# Patient Record
Sex: Female | Born: 1960 | Race: White | Hispanic: No | Smoking: Former smoker
Health system: Southern US, Community
[De-identification: ages and names within clinical notes are randomized; demographics above are authoritative.]

## PROBLEM LIST (undated history)

## (undated) DIAGNOSIS — N83209 Unspecified ovarian cyst, unspecified side: Secondary | ICD-10-CM

## (undated) DIAGNOSIS — H9190 Unspecified hearing loss, unspecified ear: Secondary | ICD-10-CM

## (undated) DIAGNOSIS — O24419 Gestational diabetes mellitus in pregnancy, unspecified control: Secondary | ICD-10-CM

## (undated) HISTORY — PX: INTRAUTERINE DEVICE INSERTION: SHX323

## (undated) HISTORY — DX: Gestational diabetes mellitus in pregnancy, unspecified control: O24.419

## (undated) HISTORY — DX: Unspecified hearing loss, unspecified ear: H91.90

## (undated) HISTORY — DX: Unspecified ovarian cyst, unspecified side: N83.209

---

## 1998-06-14 ENCOUNTER — Other Ambulatory Visit: Admission: RE | Admit: 1998-06-14 | Discharge: 1998-06-14 | Payer: Self-pay | Admitting: Gynecology

## 1998-07-20 ENCOUNTER — Ambulatory Visit (HOSPITAL_COMMUNITY): Admission: RE | Admit: 1998-07-20 | Discharge: 1998-07-20 | Payer: Self-pay | Admitting: Gynecology

## 1998-07-20 ENCOUNTER — Encounter: Payer: Self-pay | Admitting: Gynecology

## 1999-08-07 ENCOUNTER — Other Ambulatory Visit: Admission: RE | Admit: 1999-08-07 | Discharge: 1999-08-07 | Payer: Self-pay | Admitting: Gynecology

## 1999-08-28 ENCOUNTER — Ambulatory Visit (HOSPITAL_COMMUNITY): Admission: RE | Admit: 1999-08-28 | Discharge: 1999-08-28 | Payer: Self-pay | Admitting: Family Medicine

## 1999-08-28 ENCOUNTER — Encounter: Payer: Self-pay | Admitting: Gynecology

## 2000-07-28 ENCOUNTER — Other Ambulatory Visit: Admission: RE | Admit: 2000-07-28 | Discharge: 2000-07-28 | Payer: Self-pay | Admitting: Family Medicine

## 2000-09-30 ENCOUNTER — Encounter: Payer: Self-pay | Admitting: Family Medicine

## 2000-09-30 ENCOUNTER — Ambulatory Visit (HOSPITAL_COMMUNITY): Admission: RE | Admit: 2000-09-30 | Discharge: 2000-09-30 | Payer: Self-pay | Admitting: Family Medicine

## 2001-10-14 ENCOUNTER — Encounter: Payer: Self-pay | Admitting: Family Medicine

## 2001-10-14 ENCOUNTER — Ambulatory Visit (HOSPITAL_COMMUNITY): Admission: RE | Admit: 2001-10-14 | Discharge: 2001-10-14 | Payer: Self-pay | Admitting: Family Medicine

## 2002-01-19 ENCOUNTER — Other Ambulatory Visit: Admission: RE | Admit: 2002-01-19 | Discharge: 2002-01-19 | Payer: Self-pay | Admitting: Family Medicine

## 2002-10-27 ENCOUNTER — Ambulatory Visit (HOSPITAL_COMMUNITY): Admission: RE | Admit: 2002-10-27 | Discharge: 2002-10-27 | Payer: Self-pay | Admitting: Family Medicine

## 2002-10-27 ENCOUNTER — Encounter: Payer: Self-pay | Admitting: Family Medicine

## 2003-09-07 ENCOUNTER — Other Ambulatory Visit: Admission: RE | Admit: 2003-09-07 | Discharge: 2003-09-07 | Payer: Self-pay | Admitting: Family Medicine

## 2003-11-07 ENCOUNTER — Ambulatory Visit (HOSPITAL_COMMUNITY): Admission: RE | Admit: 2003-11-07 | Discharge: 2003-11-07 | Payer: Self-pay | Admitting: Family Medicine

## 2004-11-28 ENCOUNTER — Ambulatory Visit (HOSPITAL_COMMUNITY): Admission: RE | Admit: 2004-11-28 | Discharge: 2004-11-28 | Payer: Self-pay | Admitting: Gynecology

## 2005-12-03 ENCOUNTER — Other Ambulatory Visit: Admission: RE | Admit: 2005-12-03 | Discharge: 2005-12-03 | Payer: Self-pay | Admitting: Family Medicine

## 2005-12-19 ENCOUNTER — Ambulatory Visit (HOSPITAL_COMMUNITY): Admission: RE | Admit: 2005-12-19 | Discharge: 2005-12-19 | Payer: Self-pay | Admitting: Family Medicine

## 2007-02-04 ENCOUNTER — Ambulatory Visit (HOSPITAL_COMMUNITY): Admission: RE | Admit: 2007-02-04 | Discharge: 2007-02-04 | Payer: Self-pay | Admitting: Family Medicine

## 2007-12-16 ENCOUNTER — Other Ambulatory Visit: Admission: RE | Admit: 2007-12-16 | Discharge: 2007-12-16 | Payer: Self-pay | Admitting: Family Medicine

## 2008-02-10 ENCOUNTER — Ambulatory Visit (HOSPITAL_COMMUNITY): Admission: RE | Admit: 2008-02-10 | Discharge: 2008-02-10 | Payer: Self-pay | Admitting: Family Medicine

## 2009-03-22 ENCOUNTER — Other Ambulatory Visit: Admission: RE | Admit: 2009-03-22 | Discharge: 2009-03-22 | Payer: Self-pay | Admitting: Gynecology

## 2009-03-22 ENCOUNTER — Ambulatory Visit: Payer: Self-pay | Admitting: Gynecology

## 2009-04-05 ENCOUNTER — Ambulatory Visit: Payer: Self-pay | Admitting: Gynecology

## 2010-10-03 ENCOUNTER — Other Ambulatory Visit: Payer: Self-pay | Admitting: Gynecology

## 2010-10-03 ENCOUNTER — Other Ambulatory Visit (HOSPITAL_COMMUNITY)
Admission: RE | Admit: 2010-10-03 | Discharge: 2010-10-03 | Disposition: A | Discharge status: Home / Self Care | Payer: BC Managed Care – PPO | Source: Ambulatory Visit | Attending: Gynecology | Admitting: Gynecology

## 2010-10-03 ENCOUNTER — Encounter (INDEPENDENT_AMBULATORY_CARE_PROVIDER_SITE_OTHER): Payer: BC Managed Care – PPO | Admitting: Gynecology

## 2010-10-03 DIAGNOSIS — Z833 Family history of diabetes mellitus: Secondary | ICD-10-CM

## 2010-10-03 DIAGNOSIS — N926 Irregular menstruation, unspecified: Secondary | ICD-10-CM

## 2010-10-03 DIAGNOSIS — Z124 Encounter for screening for malignant neoplasm of cervix: Secondary | ICD-10-CM | POA: Insufficient documentation

## 2010-10-03 DIAGNOSIS — Z13220 Encounter for screening for lipoid disorders: Secondary | ICD-10-CM

## 2010-10-03 DIAGNOSIS — Z01419 Encounter for gynecological examination (general) (routine) without abnormal findings: Secondary | ICD-10-CM

## 2010-12-20 ENCOUNTER — Encounter: Payer: Self-pay | Admitting: Internal Medicine

## 2010-12-20 ENCOUNTER — Ambulatory Visit (AMBULATORY_SURGERY_CENTER): Payer: BC Managed Care – PPO | Admitting: *Deleted

## 2010-12-20 VITALS — Ht 63.0 in | Wt 138.6 lb

## 2010-12-20 DIAGNOSIS — Z1211 Encounter for screening for malignant neoplasm of colon: Secondary | ICD-10-CM

## 2010-12-20 MED ORDER — SUPREP BOWEL PREP KIT 17.5-3.13-1.6 GM/177ML PO SOLN: 1.0000 | Freq: Once | ORAL | Status: DC

## 2011-01-03 ENCOUNTER — Ambulatory Visit (AMBULATORY_SURGERY_CENTER): Payer: BC Managed Care – PPO | Admitting: Internal Medicine

## 2011-01-03 ENCOUNTER — Encounter: Payer: Self-pay | Admitting: Internal Medicine

## 2011-01-03 VITALS — BP 134/59 | HR 47 | Temp 98.4°F | Resp 12 | Ht 64.8 in | Wt 138.0 lb

## 2011-01-03 DIAGNOSIS — Z1211 Encounter for screening for malignant neoplasm of colon: Secondary | ICD-10-CM

## 2011-01-03 MED ORDER — SODIUM CHLORIDE 0.9 % IV SOLN: 500.0000 mL | INTRAVENOUS | Status: DC

## 2011-01-03 NOTE — Patient Instructions (Signed)
Normal colonoscopy  High fiber diet recommended  Recall colonoscopy in 10 years.  See green and blue sheets for additional d/c instructions.

## 2011-01-04 ENCOUNTER — Telehealth: Payer: Self-pay | Admitting: *Deleted

## 2011-01-04 NOTE — Telephone Encounter (Signed)
No message left.  No ID on voice mail. 

## 2011-09-16 ENCOUNTER — Other Ambulatory Visit: Payer: Self-pay | Admitting: *Deleted

## 2011-09-16 MED ORDER — ALPRAZOLAM 0.25 MG PO TABS: 0.2500 mg | ORAL_TABLET | Freq: Every evening | ORAL | Status: DC | PRN

## 2011-09-17 NOTE — Telephone Encounter (Signed)
rx called in

## 2011-09-26 ENCOUNTER — Encounter: Payer: Self-pay | Admitting: Gynecology

## 2011-09-26 DIAGNOSIS — I10 Essential (primary) hypertension: Secondary | ICD-10-CM | POA: Insufficient documentation

## 2011-10-04 ENCOUNTER — Ambulatory Visit (INDEPENDENT_AMBULATORY_CARE_PROVIDER_SITE_OTHER): Payer: BC Managed Care – PPO | Admitting: Gynecology

## 2011-10-04 ENCOUNTER — Encounter: Payer: Self-pay | Admitting: Gynecology

## 2011-10-04 ENCOUNTER — Encounter: Payer: BC Managed Care – PPO | Admitting: Gynecology

## 2011-10-04 VITALS — BP 118/70 | Ht 63.0 in | Wt 139.0 lb

## 2011-10-04 DIAGNOSIS — Z01419 Encounter for gynecological examination (general) (routine) without abnormal findings: Secondary | ICD-10-CM

## 2011-10-04 DIAGNOSIS — Z30431 Encounter for routine checking of intrauterine contraceptive device: Secondary | ICD-10-CM

## 2011-10-04 DIAGNOSIS — Z13220 Encounter for screening for lipoid disorders: Secondary | ICD-10-CM

## 2011-10-04 DIAGNOSIS — N926 Irregular menstruation, unspecified: Secondary | ICD-10-CM

## 2011-10-04 DIAGNOSIS — Z131 Encounter for screening for diabetes mellitus: Secondary | ICD-10-CM

## 2011-10-04 LAB — CBC WITH DIFFERENTIAL/PLATELET
Basophils Relative: 1 % (ref 0–1)
Eosinophils Relative: 1 % (ref 0–5)
HCT: 37.2 % (ref 36.0–46.0)
Lymphocytes Relative: 34 % (ref 12–46)
MCH: 30.3 pg (ref 26.0–34.0)
MCHC: 33.6 g/dL (ref 30.0–36.0)
Monocytes Relative: 13 % — ABNORMAL HIGH (ref 3–12)
RDW: 13.9 % (ref 11.5–15.5)

## 2011-10-04 LAB — LIPID PANEL
Cholesterol: 225 mg/dL — ABNORMAL HIGH (ref 0–200)
HDL: 75 mg/dL (ref >39)
Triglycerides: 122 mg/dL (ref <150)

## 2011-10-04 NOTE — Progress Notes (Signed)
Arlee Bossard 1960/11/21 [id number]        51 y.o.  for annual exam.  Several issues noted below.  Past medical history,surgical history, medications, allergies, family history and social history were all reviewed and documented in the EPIC chart. ROS:  Was performed and pertinent positives and negatives are included in the history.  Exam: Elane Fritz chaperone present Filed Vitals:   10/04/11 0939  BP: 118/70   General appearance  Normal Skin grossly normal Head/Neck normal with no cervical or supraclavicular adenopathy thyroid normal Lungs  clear Cardiac RR, without RMG Abdominal  soft, nontender, without masses, organomegaly or hernia Breasts  examined lying and sitting without masses, retractions, discharge or axillary adenopathy. Pelvic  Ext/BUS/vagina  normal   Cervix  Normal with IUD string visualized  Uterus  anteverted, normal size, shape and contour, midline and mobile nontender   Adnexa  Without masses or tenderness    Anus and perineum  normal   Rectovaginal  normal sphincter tone without palpated masses or tenderness.    Assessment/Plan:  51 y.o. female for annual exam.    1. IUD management. Patient is 5 years with her Mirena this coming August. She's had 2 menses this past year and has occasional sporadic menses with the IUD. She did have some transient hot flashes but these have resolved. She's having no other menopausal symptoms. We'll check FSH now. Options for management to include removing IUD keeping menstrual calendar given her age using backup contraception and then going from there versus replacing the IUD discussed. We'll further discuss after her [facility] when she returns in August for her IUD removal. 2. Mammography. Patient is due for her mammogram now and knows to schedule it. SBE monthly reviewed. 3. Pap smear. No Pap smear done today. Last Pap smear 2012. No history of abnormal Pap smears before. Reviewed current screening guidelines we'll plan every 3-5 your Pap  smears. 4. Colonoscopy. Patient had her colonoscopy last year. 5. Health maintenance. Baseline CBC lipid profile glucose urinalysis ordered along with her FSH.    Dara Lords MD, 10:12 AM 10/04/2011

## 2011-10-04 NOTE — Patient Instructions (Signed)
Office will call you with the hormone results. You will need to have your IUD removed by August 2013. We will discuss whether to replace it or not

## 2011-10-05 LAB — URINALYSIS W MICROSCOPIC + REFLEX CULTURE
Bilirubin Urine: NEGATIVE
Casts: NONE SEEN
Crystals: NONE SEEN
Hgb urine dipstick: NEGATIVE
Leukocytes, UA: NEGATIVE
Nitrite: NEGATIVE
Urobilinogen, UA: 0.2 mg/dL (ref 0.0–1.0)
pH: 6.5 (ref 5.0–8.0)

## 2011-10-07 ENCOUNTER — Other Ambulatory Visit: Payer: Self-pay | Admitting: *Deleted

## 2011-10-07 DIAGNOSIS — E7800 Pure hypercholesterolemia, unspecified: Secondary | ICD-10-CM

## 2011-10-23 ENCOUNTER — Encounter: Payer: Self-pay | Admitting: Gynecology

## 2011-11-12 ENCOUNTER — Other Ambulatory Visit: Payer: BC Managed Care – PPO

## 2011-11-12 DIAGNOSIS — E7800 Pure hypercholesterolemia, unspecified: Secondary | ICD-10-CM

## 2011-11-12 LAB — LIPID PANEL
HDL: 62 mg/dL (ref >39)
LDL Cholesterol: 124 mg/dL — ABNORMAL HIGH (ref 0–99)
VLDL: 18 mg/dL (ref 0–40)

## 2011-11-25 ENCOUNTER — Ambulatory Visit (INDEPENDENT_AMBULATORY_CARE_PROVIDER_SITE_OTHER): Payer: BC Managed Care – PPO | Admitting: Gynecology

## 2011-11-25 ENCOUNTER — Encounter: Payer: Self-pay | Admitting: Gynecology

## 2011-11-25 DIAGNOSIS — N951 Menopausal and female climacteric states: Secondary | ICD-10-CM

## 2011-11-25 DIAGNOSIS — Z30432 Encounter for removal of intrauterine contraceptive device: Secondary | ICD-10-CM

## 2011-11-25 DIAGNOSIS — R51 Headache: Secondary | ICD-10-CM

## 2011-11-25 MED ORDER — ALPRAZOLAM 0.25 MG PO TABS: 0.2500 mg | ORAL_TABLET | Freq: Every evening | ORAL | Status: DC | PRN

## 2011-11-25 MED ORDER — ISOMETHEPTENE-APAP-DICHLORAL 65-325-100 MG PO CAPS: 1.0000 | ORAL_CAPSULE | Freq: Four times a day (QID) | ORAL | Status: DC | PRN

## 2011-11-25 NOTE — Progress Notes (Signed)
Patient presents to have her Mirena IUD removed at five-year interval.  She also asked for a refill of her Xanax 0.25 that she uses for anxiety. Her mother has been staying with her on and off and causing some stress. She does have headaches tension type and uses Midrin when necessary and does well with this and asked for a refill also. Lastly she notes some hot flashes and sweats. Not overly bothersome at this point. I reviewed options to include OTC soy based as well as HRT. At this point she wants to observe them. If they increase and become overly bothersome she'll try the soy and if this does not work she'll represent for HRT discussion.  Exam was Heather Bray Asst. External BUS vagina normal. Cervix normal IUD string visualized. Uterus normal size midline mobile nontender. Adnexa without masses or tenderness. The  Procedure: The IUD string was visualized, grasped with a [place] forcep and the Mirena IUD was removed, shown to the patient and discarded.  Assessment and plan: IUD removal. Stressed the need to use barrier contraception such as condoms and keep a menstrual calendar. Lungs less frequent but relatively regular menses and will monitor. Prolonged or atypical bleeding she'll represent. FSH was mildly elevated in June 2013. We'll rediscuss in June 2014. If hot flashes become more significant and will represent for discussion of HRT.

## 2011-11-25 NOTE — Patient Instructions (Signed)
Use barrier (condom) contraception for now. Keep menstrual calendar. Follow up if significant irregular bleeding or if hot flashes become overly bothersome. Otherwise follow up in June 2014 for annual exam.

## 2011-12-02 ENCOUNTER — Other Ambulatory Visit: Payer: Self-pay | Admitting: *Deleted

## 2011-12-02 MED ORDER — ALPRAZOLAM 0.25 MG PO TABS: 0.2500 mg | ORAL_TABLET | Freq: Every evening | ORAL | Status: DC | PRN

## 2011-12-02 NOTE — Telephone Encounter (Signed)
rx called in

## 2012-03-30 ENCOUNTER — Encounter: Payer: Self-pay | Admitting: Gynecology

## 2012-03-30 ENCOUNTER — Ambulatory Visit (INDEPENDENT_AMBULATORY_CARE_PROVIDER_SITE_OTHER): Payer: BC Managed Care – PPO | Admitting: Gynecology

## 2012-03-30 DIAGNOSIS — N920 Excessive and frequent menstruation with regular cycle: Secondary | ICD-10-CM

## 2012-03-30 DIAGNOSIS — N946 Dysmenorrhea, unspecified: Secondary | ICD-10-CM

## 2012-03-30 LAB — CBC WITH DIFFERENTIAL/PLATELET
Basophils Absolute: 0 10*3/uL (ref 0.0–0.1)
Hemoglobin: 11.6 g/dL — ABNORMAL LOW (ref 12.0–15.0)
Lymphocytes Relative: 41 % (ref 12–46)
Lymphs Abs: 1.9 10*3/uL (ref 0.7–4.0)
MCHC: 33.7 g/dL (ref 30.0–36.0)
Monocytes Relative: 11 % (ref 3–12)
Neutro Abs: 2 10*3/uL (ref 1.7–7.7)
Platelets: 221 10*3/uL (ref 150–400)
RBC: 3.83 MIL/uL — ABNORMAL LOW (ref 3.87–5.11)
RDW: 13.5 % (ref 11.5–15.5)
WBC: 4.5 10*3/uL (ref 4.0–10.5)

## 2012-03-30 MED ORDER — IBUPROFEN 800 MG PO TABS: 800.0000 mg | ORAL_TABLET | Freq: Three times a day (TID) | ORAL | Status: DC | PRN

## 2012-03-30 NOTE — Patient Instructions (Signed)
Follow up for ultrasound and lab results.

## 2012-03-30 NOTE — Progress Notes (Signed)
Patient presents having had her Mirena IUD removed August 2013. She an elevated FSH of 35 and the plan was to keep a menstrual calendar and follow. Her first menses having been amenorrheic with her IUD was October which is very heavy and painful. She started a second menses in November which again was very heavy and painful. No intermenstrual bleeding. No hot flushes night sweats. She does note emotional lability with crying spells.  Exam with Heather Bray assistant Abdomen soft nontender without masses guarding rebound organomegaly. Pelvic external BUS vagina normal. Cervix normal. Uterus mildly irregular generous in size. Adnexa without masses or tenderness.  Assessment and plan: Perimenopausal patient with heavy crampy periods. Exam questionable small myomas. Start with baseline CBC, TSH and sonohysterogram. Motrin 800 mg #30 one refill prescribed for her next menses. Various options and scenarios reviewed. Possible HRT also discussed. We'll follow for lab and ultrasound and will triage based on these results.

## 2012-04-29 ENCOUNTER — Encounter: Payer: Self-pay | Admitting: Gynecology

## 2012-04-29 ENCOUNTER — Ambulatory Visit (INDEPENDENT_AMBULATORY_CARE_PROVIDER_SITE_OTHER): Payer: BC Managed Care – PPO

## 2012-04-29 ENCOUNTER — Ambulatory Visit (INDEPENDENT_AMBULATORY_CARE_PROVIDER_SITE_OTHER): Payer: BC Managed Care – PPO | Admitting: Gynecology

## 2012-04-29 DIAGNOSIS — N920 Excessive and frequent menstruation with regular cycle: Secondary | ICD-10-CM

## 2012-04-29 DIAGNOSIS — N83209 Unspecified ovarian cyst, unspecified side: Secondary | ICD-10-CM

## 2012-04-29 DIAGNOSIS — D251 Intramural leiomyoma of uterus: Secondary | ICD-10-CM

## 2012-04-29 DIAGNOSIS — D259 Leiomyoma of uterus, unspecified: Secondary | ICD-10-CM

## 2012-04-29 DIAGNOSIS — N946 Dysmenorrhea, unspecified: Secondary | ICD-10-CM

## 2012-04-29 NOTE — Patient Instructions (Signed)
Follow up for ultrasound as scheduled in 2-3 months. Follow up sooner if irregular bleeding or pain.

## 2012-04-29 NOTE — Progress Notes (Signed)
Patient presents for sonohysterogram with history of having had her Mirena IUD removed August 2013. She an elevated FSH of 35 and the plan was to keep a menstrual calendar and follow. Her first menses having been amenorrheic with her IUD was October which is very heavy and painful. She started a second menses in November which again was very heavy and painful. No intermenstrual bleeding. No hot flushes night sweats.  Ultrasound shows uterus overall normal in size with endometrial echo 6.0 mm. Several small myomas noted largest 23 mm. Right ovary normal. Left ovary with thin walled 32 mm mean simple cyst avascular echo-free. No cul-de-sac fluid. Sonohysterogram performed, sterile technique, easy catheter introduction, good distention with no abnormalities. Endometrial sample taken. Patient tolerated well.  Assessment and plan:  Irregular heavy menses. Marginal FSH. Small myomas. Normal endometrial cavity. Simple left ovarian cyst avascular echo-free. Patient will follow up for endometrial biopsy results. Keep menstrual calendar and repeat ultrasound in 2-3 months to follow up on the ovarian cyst. Various scenarios were reviewed. I discussed I cannot guarantee this is not ovarian cancer but unlikely. If the cyst enlarges or she starts to develop pain then we'll proceed with surgery. If cyst resolves and menses return normal then we'll follow. Patient is to follow up sooner if irregular bleeding or pain presents.

## 2012-07-01 ENCOUNTER — Encounter: Payer: Self-pay | Admitting: Gynecology

## 2012-07-01 ENCOUNTER — Ambulatory Visit (INDEPENDENT_AMBULATORY_CARE_PROVIDER_SITE_OTHER): Payer: BC Managed Care – PPO

## 2012-07-01 ENCOUNTER — Ambulatory Visit: Payer: BC Managed Care – PPO | Admitting: Gynecology

## 2012-07-01 ENCOUNTER — Ambulatory Visit (INDEPENDENT_AMBULATORY_CARE_PROVIDER_SITE_OTHER): Payer: BC Managed Care – PPO | Admitting: Gynecology

## 2012-07-01 ENCOUNTER — Other Ambulatory Visit: Payer: BC Managed Care – PPO

## 2012-07-01 DIAGNOSIS — N8300 Follicular cyst of ovary, unspecified side: Secondary | ICD-10-CM

## 2012-07-01 DIAGNOSIS — N83209 Unspecified ovarian cyst, unspecified side: Secondary | ICD-10-CM

## 2012-07-01 DIAGNOSIS — N912 Amenorrhea, unspecified: Secondary | ICD-10-CM

## 2012-07-01 DIAGNOSIS — N951 Menopausal and female climacteric states: Secondary | ICD-10-CM

## 2012-07-01 DIAGNOSIS — D259 Leiomyoma of uterus, unspecified: Secondary | ICD-10-CM

## 2012-07-01 NOTE — Progress Notes (Signed)
Patient presents for followup ultrasound.  She had her IUD removed in the fall and had several heavy menses following.  Had an [facility] of 35 and had a sonohysterogram which showed several small myomas. Endometrial echo of 6 mm and a 32 mm left ovarian cyst. Initial sample showed secret Tory endometrium. She was asked to return in several months after keeping a menstrual calendar and followup for ultrasound to make sure that this ovarian cyst has resolved. At that point she was having no menopausal symptoms and otherwise was doing well.  Ultrasound shows uterus overall normal in size. Endometrial echo 2.8 mm. Several small myomas noted. Right and left ovaries visualized and normal with resolution of the prior cystic area. Cul-de-sac negative.  Assessment and plan: Resolution of the ovarian cyst. Leiomyoma stable. Now with symptoms of hot flushes and night sweats with thin endometrial echo on ultrasound. Has had no vaginal bleeding since her ultrasound appointment. The patient and I reviewed options for her menopausal symptoms to include observation, OTC products such as soy based, nonhormonal pharmacologic such as Effexor and HRT. We had a very extensive discussion about HRT to include the WHI study and risks of stroke heart attack DVT and breast cancer. The applicability of the study to a 52 year old perimenopausal patient. Her family history to include her mother with breast cancer 3 or 4 years post menopausal as well as a great-grandmother with breast cancer. She has 3 maternal aunts none of which have breast cancer. Various options to include transdermal with avoidance of first pass effect benefits. Estrogen and progesterone together and the various formulations to include patch, pill, lotions. Continuous HRT versus intermittent progesterone withdrawals also discussed. After a lengthy discussion the patient wants to try OTC soy and will call me if she wants to proceed with HRT. Otherwise from her ovarian cyst and  leiomyoma standpoint we will plan observation with followup exam at her annual exam which is due in June.

## 2012-07-01 NOTE — Patient Instructions (Signed)
Try over-the-counter soy based products. If her hot flushes or night sweats worsen in you want to rediscuss hormone replacement let the doctor know.

## 2012-07-02 ENCOUNTER — Other Ambulatory Visit: Payer: BC Managed Care – PPO

## 2012-07-02 ENCOUNTER — Ambulatory Visit: Payer: BC Managed Care – PPO | Admitting: Gynecology

## 2012-10-05 ENCOUNTER — Ambulatory Visit (INDEPENDENT_AMBULATORY_CARE_PROVIDER_SITE_OTHER): Payer: BC Managed Care – PPO | Admitting: Gynecology

## 2012-10-05 ENCOUNTER — Encounter: Payer: Self-pay | Admitting: Gynecology

## 2012-10-05 VITALS — BP 120/74 | Ht 62.0 in | Wt 146.0 lb

## 2012-10-05 DIAGNOSIS — N926 Irregular menstruation, unspecified: Secondary | ICD-10-CM

## 2012-10-05 DIAGNOSIS — Z01419 Encounter for gynecological examination (general) (routine) without abnormal findings: Secondary | ICD-10-CM

## 2012-10-05 DIAGNOSIS — Z13220 Encounter for screening for lipoid disorders: Secondary | ICD-10-CM

## 2012-10-05 LAB — COMPREHENSIVE METABOLIC PANEL
AST: 37 U/L (ref 0–37)
Albumin: 4.2 g/dL (ref 3.5–5.2)
BUN: 11 mg/dL (ref 6–23)
Calcium: 9.6 mg/dL (ref 8.4–10.5)
Chloride: 105 mEq/L (ref 96–112)
Glucose, Bld: 73 mg/dL (ref 70–99)
Potassium: 4.8 mEq/L (ref 3.5–5.3)
Sodium: 140 mEq/L (ref 135–145)
Total Protein: 6.2 g/dL (ref 6.0–8.3)

## 2012-10-05 LAB — CBC WITH DIFFERENTIAL/PLATELET
Basophils Absolute: 0.1 10*3/uL (ref 0.0–0.1)
Basophils Relative: 1 % (ref 0–1)
Eosinophils Absolute: 0.1 10*3/uL (ref 0.0–0.7)
Eosinophils Relative: 1 % (ref 0–5)
Hemoglobin: 12 g/dL (ref 12.0–15.0)
Lymphs Abs: 1.9 10*3/uL (ref 0.7–4.0)
Monocytes Relative: 12 % (ref 3–12)
RBC: 4.03 MIL/uL (ref 3.87–5.11)
WBC: 4.4 10*3/uL (ref 4.0–10.5)

## 2012-10-05 LAB — LIPID PANEL
Cholesterol: 205 mg/dL — ABNORMAL HIGH (ref 0–200)
LDL Cholesterol: 99 mg/dL (ref 0–99)
Triglycerides: 195 mg/dL — ABNORMAL HIGH (ref <150)

## 2012-10-05 MED ORDER — ALPRAZOLAM 0.25 MG PO TABS: 0.2500 mg | ORAL_TABLET | Freq: Every evening | ORAL | Status: DC | PRN

## 2012-10-05 NOTE — Patient Instructions (Signed)
Keep a menstrual calendar. As long as less frequent but "regular" menses then we will follow. If prolonged or atypical bleeding then call.

## 2012-10-05 NOTE — Progress Notes (Signed)
Heather Bray 06-Aug-1960 [id number]        52 y.o.  G2P0002 for annual exam.  Several issues noted below.  Past medical history,surgical history, medications, allergies, family history and social history were all reviewed and documented in the EPIC chart.  ROS:  Performed and pertinent positives and negatives are included in the history, assessment and plan .  Exam: Kim assistant Filed Vitals:   10/05/12 1358  BP: 120/74  Height: 5\' 2"  (1.575 m)  Weight: 146 lb (66.225 kg)   General appearance  Normal Skin grossly normal Head/Neck normal with no cervical or supraclavicular adenopathy thyroid normal Lungs  clear Cardiac RR, without RMG Abdominal  soft, nontender, without masses, organomegaly or hernia Breasts  examined lying and sitting without masses, retractions, discharge or axillary adenopathy. Pelvic  Ext/BUS/vagina  normal   Cervix  normal   Uterus  anteverted, normal size, shape and contour, midline and mobile nontender   Adnexa  Without masses or tenderness    Anus and perineum  normal   Rectovaginal  normal sphincter tone without palpated masses or tenderness.    Assessment/Plan:  52 y.o. G49P0002 female for annual exam.   1. Irregular menses. Menses have become irregular this past year. She had her Mirena IUD removed 11/2011. Ultimately he had a sonohysterogram 04/2011 which was negative and had a biopsy showing secretory endometrium. She skipped several months and then just had a regular menses 09/25/2011. Was also followed for menopausal symptoms for which she tried OTC soy. The symptoms seem to gotten better area most recent [facility] was 35 checked earlier this year. We'll recheck [facility] now. Patient is not interested in HRT. Will keep menstrual calendar and as long as less frequent but "regular" menses then will follow. If prolonged or atypical bleeding or goes more than one year without bleeding and then bleeds she knows the need for evaluation. 2. Tailbone pain. For months the  patient has been having pain in her tailbone. This proceeded her sonohysterogram. She did have a small cyst on her ovary at the sonohysterogram but repeat ultrasound March 2014 showed resolution of this cyst. Her exam is normal today. I recommended followup with orthopedics for evaluation. 3. Mammography 10/2011. Continue with annual mammography next month. SBE monthly reviewed. 4. Pap smear 2012. No Pap smear done today. No history of significant abnormal Pap smears. Plan repeat next year at 3 year interval. 5. Bone health. Increase calcium vitamin D reviewed. Plan Baseline DEXA at 60. 6. Situational anxiety. Patient uses Xanax 0.25 mg when necessary for situational anxiety. #30 with one refill provided. 7. Colonoscopy 2012. Repeat at are recommended interval. Health maintenance. Baseline CBC comprehensive metabolic panel lipid profile urinalysis FSH TSH ordered. Followup one year, sooner as needed.   Dara Lords MD, 2:27 PM 10/05/2012

## 2012-10-06 LAB — URINALYSIS W MICROSCOPIC + REFLEX CULTURE
Bacteria, UA: NONE SEEN
Casts: NONE SEEN
Crystals: NONE SEEN
Ketones, ur: NEGATIVE mg/dL
Leukocytes, UA: NEGATIVE
Nitrite: NEGATIVE
Specific Gravity, Urine: 1.013 (ref 1.005–1.030)
Squamous Epithelial / LPF: NONE SEEN
pH: 7.5 (ref 5.0–8.0)

## 2013-02-18 ENCOUNTER — Other Ambulatory Visit: Payer: Self-pay

## 2013-09-29 ENCOUNTER — Encounter: Payer: Self-pay | Admitting: Gynecology

## 2013-10-06 ENCOUNTER — Encounter: Payer: BC Managed Care – PPO | Admitting: Gynecology

## 2013-12-21 ENCOUNTER — Encounter: Payer: BC Managed Care – PPO | Admitting: Gynecology

## 2014-01-21 ENCOUNTER — Encounter: Payer: Self-pay | Admitting: Family Medicine

## 2014-01-21 ENCOUNTER — Ambulatory Visit (INDEPENDENT_AMBULATORY_CARE_PROVIDER_SITE_OTHER): Payer: BC Managed Care – PPO | Admitting: Family Medicine

## 2014-01-21 VITALS — BP 114/70 | HR 58 | Ht 63.0 in | Wt 145.0 lb

## 2014-01-21 DIAGNOSIS — S99911A Unspecified injury of right ankle, initial encounter: Secondary | ICD-10-CM

## 2014-01-21 NOTE — Patient Instructions (Signed)
You have an ankle sprain. Ice the area for 15 minutes at a time, 3-4 times a day Aleve 2 tabs twice a day with food OR ibuprofen 3 tabs three times a day with food for pain and inflammation for 2 weeks then as needed. Elevate above the level of your heart when possible Crutches if needed to help with walking Bear weight when tolerated Use laceup ankle brace to help with stability while you recover from this injury for 4 weeks. Start theraband strengthening exercises daily once a day 3 sets of 10. Consider physical therapy for strengthening and balance exercises in the future. If not improving as expected, we may repeat x-rays or consider further testing like an MRI. Follow up with me in 2-3 weeks.

## 2014-01-24 ENCOUNTER — Encounter: Payer: Self-pay | Admitting: Family Medicine

## 2014-01-24 ENCOUNTER — Ambulatory Visit: Payer: BC Managed Care – PPO | Admitting: Family Medicine

## 2014-01-24 DIAGNOSIS — S99911A Unspecified injury of right ankle, initial encounter: Secondary | ICD-10-CM | POA: Insufficient documentation

## 2014-01-24 NOTE — Progress Notes (Signed)
Patient ID: Heather Bray, female   DOB: 10/17/1960, 53 y.o.   MRN: [id number]  PCP: [name], MD  Subjective:   HPI: Patient is a 53 y.o. female here for right ankle injury.  Patient reports she was walking to the rain on 10/3 to the mailbox when she slipped and inverted right ankle. Some swelling.  Limped after this. Went to an urgent care and had x-rays that were negative for a fracture. Had a lot of bruising lateral aspect of right ankle. No prior injuries.  Past Medical History  Diagnosis Date  . Ovarian cyst     Current Outpatient Prescriptions on File Prior to Visit  Medication Sig Dispense Refill  . ALPRAZolam (XANAX) 0.25 MG tablet Take 1 tablet (0.25 mg total) by mouth at bedtime as needed.  30 tablet  1  . B Complex Vitamins (B COMPLEX 1 PO) Take by mouth daily.        . Calcium-Vitamin D-Vitamin K (CALCIUM SOFT CHEWS PO) Take by mouth. Takes 2 chews daily       . ibuprofen (ADVIL,MOTRIN) 800 MG tablet Take 1 tablet (800 mg total) by mouth every 8 (eight) hours as needed for pain.  30 tablet  1  . isometheptene-acetaminophen-dichloralphenazone (MIDRIN) 65-325-100 MG capsule Take 1 capsule by mouth 4 (four) times daily as needed.  30 capsule  1  . MAGNESIUM CHLORIDE PO Take by mouth. Takes 1 tablet daily        No current facility-administered medications on file prior to visit.    History reviewed. No pertinent past surgical history.  No Known Allergies  History   Social History  . Marital Status: Married    Spouse Name: N/A    Number of Children: N/A  . Years of Education: N/A   Occupational History  . Not on file.   Social History Main Topics  . Smoking status: Former Smoker    Quit date: 12/19/2000  . Smokeless tobacco: Never Used  . Alcohol Use: 1.8 oz/week    3 Glasses of wine per week  . Drug Use: No  . Sexual Activity: Yes    Birth Control/ Protection: Condom   Other Topics Concern  . Not on file   Social History Narrative  .  No narrative on file    Family History  Problem Relation Age of Onset  . Colon cancer Neg Hx   . Stomach cancer Neg Hx   . Breast cancer Mother     post menopause 3or 4 times  . Diabetes Maternal Uncle   . Hypertension Maternal Grandfather   . Breast cancer Other     MGGM  . Dementia Maternal Grandmother   . Heart defect Father     BP 114/70  Pulse 58  Ht 5\' 3"  (1.6 m)  Wt 145 lb (65.772 kg)  BMI 25.69 kg/m2  Review of Systems: See HPI above.    Objective:  Physical Exam:  Gen: NAD  Right ankle/foot: Mild lateral swelling and bruising of ankle. Mild limitation ROM all directions.  5/5 strength. TTP over ATFL.  Minimal lateral malleolus tenderness.  No other tenderness foot or ankle. 2+ ant drawer and talar tilt.   Negative syndesmotic compression. Thompsons test negative. NV intact distally.    Assessment & Plan:  1. Right ankle injury - radiographs negative.  2/2 sprain.  Icing, nsaids, elevation.  ASO for stability.  Start home exercises which were reviewed today.  Consider physical therapy, repeating imaging if not improving.  F/u  in 2-3 weeks.

## 2014-01-24 NOTE — Assessment & Plan Note (Signed)
radiographs negative.  2/2 sprain.  Icing, nsaids, elevation.  ASO for stability.  Start home exercises which were reviewed today.  Consider physical therapy, repeating imaging if not improving.  F/u in 2-3 weeks.

## 2014-02-09 ENCOUNTER — Ambulatory Visit: Payer: BC Managed Care – PPO | Admitting: Family Medicine

## 2014-02-11 ENCOUNTER — Ambulatory Visit (INDEPENDENT_AMBULATORY_CARE_PROVIDER_SITE_OTHER): Payer: BC Managed Care – PPO | Admitting: Family Medicine

## 2014-02-11 ENCOUNTER — Encounter: Payer: Self-pay | Admitting: Family Medicine

## 2014-02-11 VITALS — BP 116/70 | HR 56 | Ht 62.0 in | Wt 150.0 lb

## 2014-02-11 DIAGNOSIS — S99911D Unspecified injury of right ankle, subsequent encounter: Secondary | ICD-10-CM

## 2014-02-11 NOTE — Patient Instructions (Signed)
Continue home exercises with theraband most days of the week for next 4 weeks. Use ankle brace for support if going to be standing or walking a lot or if on an irregular surface (like a trail) for 4 weeks. I'd wait about a week to start running and start with a walk:jog program on a level surface. Follow up with me in 4 weeks or as needed.

## 2014-02-14 ENCOUNTER — Encounter: Payer: Self-pay | Admitting: Family Medicine

## 2014-02-16 NOTE — Progress Notes (Signed)
Patient ID: Heather Bray, female   DOB: Jan 07, 1961, 53 y.o.   MRN: [id number]  PCP: [name], MD  Subjective:   HPI: Patient is a 53 y.o. female here for right ankle injury.  10/9: Patient reports she was walking to the rain on 10/3 to the mailbox when she slipped and inverted right ankle. Some swelling.  Limped after this. Went to an urgent care and had x-rays that were negative for a fracture. Had a lot of bruising lateral aspect of right ankle. No prior injuries.  10/30: Patient reports she is 75% improved. Slight swelling. Doing HEP and using ASO. Not taking any medication for pain currently.  Past Medical History  Diagnosis Date  . Ovarian cyst     Current Outpatient Prescriptions on File Prior to Visit  Medication Sig Dispense Refill  . ALPRAZolam (XANAX) 0.25 MG tablet Take 1 tablet (0.25 mg total) by mouth at bedtime as needed. 30 tablet 1  . B Complex Vitamins (B COMPLEX 1 PO) Take by mouth daily.      . Calcium-Vitamin D-Vitamin K (CALCIUM SOFT CHEWS PO) Take by mouth. Takes 2 chews daily     . ibuprofen (ADVIL,MOTRIN) 800 MG tablet Take 1 tablet (800 mg total) by mouth every 8 (eight) hours as needed for pain. 30 tablet 1  . isometheptene-acetaminophen-dichloralphenazone (MIDRIN) 65-325-100 MG capsule Take 1 capsule by mouth 4 (four) times daily as needed. 30 capsule 1  . MAGNESIUM CHLORIDE PO Take by mouth. Takes 1 tablet daily      No current facility-administered medications on file prior to visit.    No past surgical history on file.  No Known Allergies  History   Social History  . Marital Status: Married    Spouse Name: N/A    Number of Children: N/A  . Years of Education: N/A   Occupational History  . Not on file.   Social History Main Topics  . Smoking status: Former Smoker    Quit date: 12/19/2000  . Smokeless tobacco: Never Used  . Alcohol Use: 1.8 oz/week    3 Glasses of wine per week  . Drug Use: No  . Sexual Activity:  Yes    Birth Control/ Protection: Condom   Other Topics Concern  . Not on file   Social History Narrative    Family History  Problem Relation Age of Onset  . Colon cancer Neg Hx   . Stomach cancer Neg Hx   . Breast cancer Mother     post menopause 3or 4 times  . Diabetes Maternal Uncle   . Hypertension Maternal Grandfather   . Breast cancer Other     MGGM  . Dementia Maternal Grandmother   . Heart defect Father     BP 116/70 mmHg  Pulse 56  Ht 5\' 2"  (1.575 m)  Wt 150 lb (68.04 kg)  BMI 27.43 kg/m2  Review of Systems: See HPI above.    Objective:  Physical Exam:  Gen: NAD  Right ankle/foot: Mild lateral swelling and bruising of ankle. FROM.  5/5 strength. Minimal TTP over ATFL.  No lateral malleolus tenderness.  No other tenderness foot or ankle. 1+ ant drawer and talar tilt.   Negative syndesmotic compression. Thompsons test negative. NV intact distally.    Assessment & Plan:  1. Right ankle injury - radiographs negative.  2/2 sprain. Continue HEP for 4 more weeks.  ASO for long walks, standing or irregular surfaces for 4 more weeks.  Wait a week before starting  walk:jog program.  F/u in 4 weeks or prn.

## 2014-02-16 NOTE — Assessment & Plan Note (Signed)
radiographs negative.  2/2 sprain. Continue HEP for 4 more weeks.  ASO for long walks, standing or irregular surfaces for 4 more weeks.  Wait a week before starting walk:jog program.  F/u in 4 weeks or prn.

## 2014-10-26 ENCOUNTER — Encounter: Payer: Self-pay | Admitting: Gynecology

## 2014-12-22 ENCOUNTER — Other Ambulatory Visit (HOSPITAL_COMMUNITY)
Admission: RE | Admit: 2014-12-22 | Discharge: 2014-12-22 | Disposition: A | Discharge status: Home / Self Care | Payer: BLUE CROSS/BLUE SHIELD | Source: Ambulatory Visit | Attending: Gynecology | Admitting: Gynecology

## 2014-12-22 ENCOUNTER — Encounter: Payer: Self-pay | Admitting: Gynecology

## 2014-12-22 ENCOUNTER — Ambulatory Visit (INDEPENDENT_AMBULATORY_CARE_PROVIDER_SITE_OTHER): Payer: BLUE CROSS/BLUE SHIELD | Admitting: Gynecology

## 2014-12-22 VITALS — BP 118/80 | Ht 62.0 in | Wt 150.0 lb

## 2014-12-22 DIAGNOSIS — N926 Irregular menstruation, unspecified: Secondary | ICD-10-CM | POA: Diagnosis not present

## 2014-12-22 DIAGNOSIS — Z01419 Encounter for gynecological examination (general) (routine) without abnormal findings: Secondary | ICD-10-CM | POA: Diagnosis not present

## 2014-12-22 DIAGNOSIS — N951 Menopausal and female climacteric states: Secondary | ICD-10-CM

## 2014-12-22 DIAGNOSIS — Z1151 Encounter for screening for human papillomavirus (HPV): Secondary | ICD-10-CM | POA: Diagnosis not present

## 2014-12-22 NOTE — Patient Instructions (Signed)

## 2014-12-22 NOTE — Progress Notes (Signed)
Heather Bray 05/04/1960 [id number]        54 y.o.  G2P0002 for annual exam.  Several issues noted below.  Past medical history,surgical history, problem list, medications, allergies, family history and social history were all reviewed and documented as reviewed in the EPIC chart.  ROS:  Performed with pertinent positives and negatives included in the history, assessment and plan.   Additional significant findings :  none   Exam: Heather Bray Vitals:   12/22/14 1551  BP: 118/80  Height: 5\' 2"  (1.575 m)  Weight: 150 lb (68.04 kg)   General appearance:  Normal affect, orientation and appearance. Skin: Grossly normal HEENT: Without gross lesions.  No cervical or supraclavicular adenopathy. Thyroid normal.  Lungs:  Clear without wheezing, rales or rhonchi Cardiac: RR, without RMG Abdominal:  Soft, nontender, without masses, guarding, rebound, organomegaly or hernia Breasts:  Examined lying and sitting without masses, retractions, discharge or axillary adenopathy. Pelvic:  Ext/BUS/vagina normal  Cervix normal. Pap/HPV  Uterus anteverted, normal size, shape and contour, midline and mobile nontender   Adnexa  Without masses or tenderness    Anus and perineum  Normal   Rectovaginal  Normal sphincter tone without palpated masses or tenderness.    Assessment/Plan:  54 y.o. G40P0002 female for annual exam with irregular menses, barrier contraception.   1. Irregular menses/menopausal symptoms.  LMP 05/2014. Menses have become more sporadic over the last 2 years. No prolonged or atypical bleeding. [place] 2 years ago 17. Keep menstrual calendar as long as less frequent regular menses will monitor. Is having hot flushes and sweats. Options for treatment to include OTC products such as soy and HRT discussed. Patient is not interested for fear of breast cancer versus just monitor. 2. Contraception. Uses barrier contraception. Will continue to do so until at least one year without  menses. 3. Pap smear 2012. Pap/HPV today. No history of significant abnormal Pap smears previously. 4. Mammography 10/2014. Continue with annual mammography. SBE monthly reviewed. 5. Colonoscopy 2012. Repeat at their recommended interval. 6. DEXA never. We'll plan further into the menopause. Check vitamin D level today. 7. Health maintenance. Patient requests routine blood work. Baseline CBC comprehensive metabolic panel lipid profile urinalysis vitamin D TSH ordered. Follow up in one year, sooner as needed.   Anastasio Auerbach MD, 4:16 PM 12/22/2014

## 2014-12-22 NOTE — Addendum Note (Signed)
Addended by: Burnett Kanaris on: 12/22/2014 04:21 PM   Modules accepted: Orders

## 2014-12-23 LAB — VITAMIN D 25 HYDROXY (VIT D DEFICIENCY, FRACTURES): VIT D 25 HYDROXY: 31 ng/mL (ref 30–100)

## 2014-12-23 LAB — URINALYSIS W MICROSCOPIC + REFLEX CULTURE
BACTERIA UA: NONE SEEN [HPF]
Bilirubin Urine: NEGATIVE
CASTS: NONE SEEN [LPF]
CRYSTALS: NONE SEEN [HPF]
Glucose, UA: NEGATIVE
HGB URINE DIPSTICK: NEGATIVE
Ketones, ur: NEGATIVE
Leukocytes, UA: NEGATIVE
Nitrite: NEGATIVE
PROTEIN: NEGATIVE
RBC / HPF: NONE SEEN RBC/HPF (ref <=2)
Specific Gravity, Urine: 1.016 (ref 1.001–1.035)
WBC, UA: NONE SEEN WBC/HPF (ref <=5)
YEAST: NONE SEEN [HPF]
pH: 5.5 (ref 5.0–8.0)

## 2014-12-23 LAB — COMPREHENSIVE METABOLIC PANEL
ALK PHOS: 59 U/L (ref 33–130)
ALT: 16 U/L (ref 6–29)
AST: 23 U/L (ref 10–35)
Albumin: 4.4 g/dL (ref 3.6–5.1)
BUN: 20 mg/dL (ref 7–25)
CHLORIDE: 100 mmol/L (ref 98–110)
CO2: 26 mmol/L (ref 20–31)
CREATININE: 0.76 mg/dL (ref 0.50–1.05)
Calcium: 9.3 mg/dL (ref 8.6–10.4)
GLUCOSE: 83 mg/dL (ref 65–99)
POTASSIUM: 4.3 mmol/L (ref 3.5–5.3)
SODIUM: 138 mmol/L (ref 135–146)
Total Bilirubin: 0.5 mg/dL (ref 0.2–1.2)
Total Protein: 6.5 g/dL (ref 6.1–8.1)

## 2014-12-23 LAB — CBC WITH DIFFERENTIAL/PLATELET
BASOS ABS: 0.1 10*3/uL (ref 0.0–0.1)
BASOS PCT: 1 % (ref 0–1)
EOS ABS: 0.1 10*3/uL (ref 0.0–0.7)
EOS PCT: 1 % (ref 0–5)
HCT: 39.2 % (ref 36.0–46.0)
Hemoglobin: 13 g/dL (ref 12.0–15.0)
LYMPHS ABS: 2.2 10*3/uL (ref 0.7–4.0)
Lymphocytes Relative: 34 % (ref 12–46)
MCH: 30 pg (ref 26.0–34.0)
MCHC: 33.2 g/dL (ref 30.0–36.0)
MCV: 90.5 fL (ref 78.0–100.0)
MPV: 10 fL (ref 8.6–12.4)
Monocytes Absolute: 0.7 10*3/uL (ref 0.1–1.0)
Monocytes Relative: 11 % (ref 3–12)
NEUTROS PCT: 53 % (ref 43–77)
Neutro Abs: 3.4 10*3/uL (ref 1.7–7.7)
PLATELETS: 252 10*3/uL (ref 150–400)
RBC: 4.33 MIL/uL (ref 3.87–5.11)
RDW: 13.5 % (ref 11.5–15.5)
WBC: 6.4 10*3/uL (ref 4.0–10.5)

## 2014-12-23 LAB — LIPID PANEL
CHOL/HDL RATIO: 2.8 ratio (ref <=5.0)
Cholesterol: 220 mg/dL — ABNORMAL HIGH (ref 125–200)
HDL: 78 mg/dL (ref >=46)
LDL Cholesterol: 118 mg/dL (ref <130)
Triglycerides: 118 mg/dL (ref <150)
VLDL: 24 mg/dL (ref <30)

## 2014-12-23 LAB — TSH: TSH: 1.907 u[IU]/mL (ref 0.350–4.500)

## 2014-12-26 LAB — CYTOLOGY - PAP

## 2015-05-01 ENCOUNTER — Ambulatory Visit
Admission: RE | Admit: 2015-05-01 | Discharge: 2015-05-01 | Disposition: A | Discharge status: Home / Self Care | Payer: BLUE CROSS/BLUE SHIELD | Source: Ambulatory Visit | Attending: Physician Assistant | Admitting: Physician Assistant

## 2015-05-01 ENCOUNTER — Other Ambulatory Visit: Payer: Self-pay | Admitting: Physician Assistant

## 2015-05-01 DIAGNOSIS — M25551 Pain in right hip: Secondary | ICD-10-CM

## 2015-06-05 ENCOUNTER — Ambulatory Visit (INDEPENDENT_AMBULATORY_CARE_PROVIDER_SITE_OTHER): Payer: BLUE CROSS/BLUE SHIELD | Admitting: Neurology

## 2015-06-05 ENCOUNTER — Encounter: Payer: Self-pay | Admitting: Neurology

## 2015-06-05 ENCOUNTER — Ambulatory Visit: Payer: BLUE CROSS/BLUE SHIELD | Admitting: Neurology

## 2015-06-05 VITALS — BP 116/74 | HR 73 | Ht 62.0 in | Wt 149.0 lb

## 2015-06-05 DIAGNOSIS — W19XXXA Unspecified fall, initial encounter: Secondary | ICD-10-CM | POA: Diagnosis not present

## 2015-06-05 DIAGNOSIS — G609 Hereditary and idiopathic neuropathy, unspecified: Secondary | ICD-10-CM | POA: Diagnosis not present

## 2015-06-05 NOTE — Patient Instructions (Signed)
1.  We will call you with EMG/NCV date and time 2.  I will likely need more blood work but will let you know 3.  ELECTROMYOGRAM AND NERVE CONDUCTION STUDIES (EMG/NCS) INSTRUCTIONS  How to Prepare The neurologist conducting the EMG will need to know if you have certain medical conditions. Tell the neurologist and other EMG lab personnel if you: . Have a pacemaker or any other electrical medical device . Take blood-thinning medications . Have hemophilia, a blood-clotting disorder that causes prolonged bleeding Bathing Take a shower or bath shortly before your exam in order to remove oils from your skin. Don't apply lotions or creams before the exam.  What to Expect You'll likely be asked to change into a hospital gown for the procedure and lie down on an examination table. The following explanations can help you understand what will happen during the exam.  . Electrodes. The neurologist or a technician places surface electrodes at various locations on your skin depending on where you're experiencing symptoms. Or the neurologist may insert needle electrodes at different sites depending on your symptoms.  . Sensations. The electrodes will at times transmit a tiny electrical current that you may feel as a twinge or spasm. The needle electrode may cause discomfort or pain that usually ends shortly after the needle is removed. If you are concerned about discomfort or pain, you may want to talk to the neurologist about taking a short break during the exam.  . Instructions. During the needle EMG, the neurologist will assess whether there is any spontaneous electrical activity when the muscle is at rest - activity that isn't present in healthy muscle tissue - and the degree of activity when you slightly contract the muscle.  He or she will give you instructions on resting and contracting a muscle at appropriate times. Depending on what muscles and nerves the neurologist is examining, he or she may ask you to  change positions during the exam.  After your EMG You may experience some temporary, minor bruising where the needle electrode was inserted into your muscle. This bruising should fade within several days. If it persists, contact your primary care doctor.

## 2015-06-05 NOTE — Progress Notes (Signed)
Heather Bray was seen today in the movement disorders clinic for neurologic consultation at the request of Heather Bray, P.A.  Her PCP is Heather Austria, MD.  The consultation is for the evaluation of falls x few years and trouble picking up her feet (4 over the past year per the patient).  The records that were made available to me were reviewed.  Pt states that she is unsure of why she falls but she is a marathon runner and the falls occurred when running.  Pt states that it seems to occur when going up a curb or when she is on the greenway and then she trips over a root that is growing.   She did recently see her eye doctor in December and he thought that wearing of sunglasses contributed to falls and told her to stop wearing sunglasses while running and that has helped.   She is now wearing a visor when running but admits that she hasn't run a lot because her husband had a stroke in January.  Her feet are numb sometimes but usually only on the ellipical.  She does yoga and skis and states that her balance is otherwise good.  She has a hx of gestational DM but no other hx of DM.  She states that she had blood work at end of Jan.  She takes a B complex vitamin but no hx of B12 deficiency - states seem to help with allergies.     ALLERGIES:  No Known Allergies  CURRENT MEDICATIONS:  Outpatient Encounter Prescriptions as of 06/05/2015  Medication Sig  . ALPRAZolam (XANAX) 0.25 MG tablet Take 1 tablet (0.25 mg total) by mouth at bedtime as needed.  . B Complex Vitamins (B COMPLEX 1 PO) Take by mouth daily.    . Calcium-Vitamin D-Vitamin K (CALCIUM SOFT CHEWS PO) Take by mouth. Takes 2 chews daily   . ibuprofen (ADVIL,MOTRIN) 800 MG tablet Take 1 tablet (800 mg total) by mouth every 8 (eight) hours as needed for pain.  Marland Kitchen MAGNESIUM CHLORIDE PO Take by mouth. Takes 1 tablet daily    No facility-administered encounter medications on file as of 06/05/2015.    PAST MEDICAL HISTORY:   Past  Medical History  Diagnosis Date  . Ovarian cyst   . Gestational diabetes     PAST SURGICAL HISTORY:  History reviewed. No pertinent past surgical history.  SOCIAL HISTORY:   Social History   Social History  . Marital Status: Married    Spouse Name: N/A  . Number of Children: N/A  . Years of Education: N/A   Occupational History  . Not on file.   Social History Main Topics  . Smoking status: Former Smoker    Quit date: 12/19/2000  . Smokeless tobacco: Never Used  . Alcohol Use: Yes     Comment: 4 nights per week - 1-2 glasses per night  . Drug Use: No  . Sexual Activity: Yes    Birth Control/ Protection: Condom   Other Topics Concern  . Not on file   Social History Narrative    FAMILY HISTORY:   Family Status  Relation Status Death Age  . Mother Alive     breast CA  . Father Alive     aortic valve disease  . Brother Alive     healthy  . 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  Denies neck or back pain.  A  complete 10 system review of systems was obtained and was unremarkable apart from what is mentioned above.  PHYSICAL EXAMINATION:    VITALS:   Filed Vitals:   06/05/15 0859  BP: 116/74  Pulse: 73  Height: 5\' 2"  (1.575 m)  Weight: 149 lb (67.586 kg)  SpO2: 98%    GEN:  The patient appears stated age and is in NAD. HEENT:  Normocephalic, atraumatic.  The mucous membranes are moist. The superficial temporal arteries are without ropiness or tenderness. CV:  RRR Lungs:  CTAB Neck/HEME:  There are no carotid bruits bilaterally.  Neurological examination:  Orientation: The patient is alert and oriented x3. Fund of knowledge is appropriate.  Recent and remote memory are intact.  Attention and concentration are normal.    Able to name objects and repeat phrases. Cranial nerves: There is good facial symmetry. Pupils are equal round and reactive to light bilaterally. Fundoscopic exam reveals clear margins bilaterally. Extraocular  muscles are intact. The visual fields are full to confrontational testing. The speech is fluent and clear. Soft palate rises symmetrically and there is no tongue deviation. Hearing is slightly decreased to conversational tone. Sensation: Sensation is intact to light and pinprick throughout (facial, trunk, extremities). Does report decreased pin in stocking distribution.  Vibration is intact at the bilateral big toe and only slightly decreased. There is no extinction with double simultaneous stimulation. There is no sensory dermatomal level identified. Motor: Strength is 5/5 in the bilateral upper and lower extremities.   Shoulder shrug is equal and symmetric.  There is no pronator drift. Deep tendon reflexes: Deep tendon reflexes are 2+/4 at the bilateral biceps, triceps, brachioradialis, patella and achilles. Plantar responses are downgoing bilaterally.  Movement examination: Tone: There is normal tone in the bilateral upper extremities.  The tone in the lower extremities is normal.  Abnormal movements: none Coordination:  There is no decremation with RAM's, with any form of RAMS, including alternating supination and pronation of the forearm, hand opening and closing, finger taps, heel taps and toe taps. Gait and Station: The patient has no difficulty arising out of a deep-seated chair without the use of the hands. The patient's stride length is normal.  Ambulates in tandem fashion with no trouble.  Able to stand in romberg position without trouble.     ASSESSMENT/PLAN:  1.  Falls while running  -none since d/c the use of sunglasses but hasn't run much either  -may have mild evidence of PN.  Didn't do labs until can get labs from PCP since pt reports done less than 3 weeks ago.  Will try to get a copy.  Does have hx of gestational DM but reports labs always have looked good.  Only other risk factor for PN is EtOH and she has just recently started drinking a little more as her husband had a stroke in  [place] and she is very stressed.  She is drinking 4-8 glasses wine/week.  -will schedule EMG  -reports that she had an MRI brain done a year ago.  I tried to look her up through [facility] and through [facility] and couldn't find her.  States that it was normal and done for increased hearing loss/tinnitus.    -neuro examination non focal and non lateralizing today.

## 2015-06-07 ENCOUNTER — Telehealth: Payer: Self-pay | Admitting: Neurology

## 2015-06-07 DIAGNOSIS — G609 Hereditary and idiopathic neuropathy, unspecified: Secondary | ICD-10-CM

## 2015-06-07 NOTE — Addendum Note (Signed)
Addended by: Annamaria Helling on: 06/07/2015 08:27 AM   Modules accepted: Orders

## 2015-06-07 NOTE — Telephone Encounter (Signed)
Left message on machine for patient to call back. To make her aware that we received labs from her PCP and need additional labs drawn: B12, folate, RPR, SPEP and UPEP. Awaiting call back to make her aware.

## 2015-06-09 ENCOUNTER — Other Ambulatory Visit: Payer: Self-pay | Admitting: *Deleted

## 2015-06-09 DIAGNOSIS — R200 Anesthesia of skin: Secondary | ICD-10-CM

## 2015-06-09 NOTE — Telephone Encounter (Signed)
CMP added per Dr Tat. Still awaiting call back from patient.

## 2015-06-12 NOTE — Telephone Encounter (Signed)
Left another message on machine for patient to call back.

## 2015-06-13 ENCOUNTER — Telehealth: Payer: Self-pay | Admitting: Neurology

## 2015-06-13 ENCOUNTER — Ambulatory Visit (INDEPENDENT_AMBULATORY_CARE_PROVIDER_SITE_OTHER): Payer: BLUE CROSS/BLUE SHIELD | Admitting: Neurology

## 2015-06-13 DIAGNOSIS — G609 Hereditary and idiopathic neuropathy, unspecified: Secondary | ICD-10-CM

## 2015-06-13 DIAGNOSIS — R200 Anesthesia of skin: Secondary | ICD-10-CM

## 2015-06-13 NOTE — Telephone Encounter (Signed)
Left message on machine for patient to call back.

## 2015-06-13 NOTE — Telephone Encounter (Signed)
Patient made aware of EMG results. Made aware she needs to have labs drawn. She wants to have drawn at Dr Reade's office since she has an appt coming up.

## 2015-06-13 NOTE — Telephone Encounter (Signed)
-----   Message from [place], DO sent at 06/13/2015 12:49 PM EST ----- Please let pt know that her EMG looked great and no evidence of a large fiber PN from diabetes

## 2015-06-13 NOTE — Telephone Encounter (Signed)
Lab req faxed to Dr Alyson Ingles with note about patient getting labs drawn in their office to fax number [phone] with confirmation received.

## 2015-06-13 NOTE — Procedures (Signed)
[facility]  [address]  [place], [place] [address] Tel: [phone] Fax:  [phone] Test Date:  06/13/2015  Patient: Heather Bray DOB: Mar 20, 1961 Physician: Narda Amber  Sex: Female Height: 5\' 3"  Ref Phys: Alonza Bogus  ID#: [id number] Temp: 32.4C Technician: Jerilynn Mages. Dean   Patient Complaints: This is a 55 year old female referred for evaluation of gait abnormality and falls.  NCV & EMG Findings: Extensive electrodiagnostic testing of the right lower extremity and additional studies of the left shows: 1. Bilateral sural and superficial peroneal sensory responses are within normal limits. 2. Bilateral peroneal and tibial motor responses are within normal limits. 3. Bilateral tibial H reflex studies are within normal limits. 4. There is no evidence of active or chronic motor axon loss changes affecting any of the tested muscles. Motor unit configuration and recruitment pattern is within normal limits.  Impression: This is a normal study of the lower extremities. In particular, there is no evidence of a generalized sensorimotor polyneuropathy or lumbosacral radiculopathy.   _____________________________ Narda Amber, D.O.    Nerve Conduction Studies Anti Sensory Summary Table   Stim Site NR Peak (ms) Norm Peak (ms) P-T Amp (V) Norm P-T Amp  Left Sup Peroneal Anti Sensory (Ant Lat Mall)  32.4C  12 cm    2.8 <4.6 11.0 >4  Right Sup Peroneal Anti Sensory (Ant Lat Mall)  32.4C  12 cm    3.3 <4.6 14.1 >4  Left Sural Anti Sensory (Lat Mall)  32.4C  Calf    3.4 <4.6 8.5 >4  Right Sural Anti Sensory (Lat Mall)  32.4C  Calf    3.3 <4.6 10.7 >4   Motor Summary Table   Stim Site NR Onset (ms) Norm Onset (ms) O-P Amp (mV) Norm O-P Amp Site1 Site2 Delta-0 (ms) Dist (cm) Vel (m/s) Norm Vel (m/s)  Left Peroneal Motor (Ext Dig Brev)  32.4C  Ankle    3.8 <6.0 5.4 >2.5 B Fib Ankle 6.0 32.0 57 >40  B Fib    9.8  5.4  Poplt B Fib 1.6 10.0 61 >40  Poplt     11.4  5.1         Right Peroneal Motor (Ext Dig Brev)  32.4C  Ankle    4.4 <6.0 3.9 >2.5 B Fib Ankle 5.6 33.0 59 >40  B Fib    10.0  3.8  Poplt B Fib 1.6 10.0 63 >40  Poplt    11.6  3.8         Left Tibial Motor (Abd Hall Brev)  32.4C  Ankle    3.2 <6.0 15.8 >4 Knee Ankle 7.8 36.0 46 >40  Knee    11.0  13.0         Right Tibial Motor (Abd Hall Brev)  32.4C  Ankle    3.6 <6.0 16.0 >4 Knee Ankle 7.9 37.0 47 >40  Knee    11.5  9.4          H Reflex Studies   NR H-Lat (ms) Lat Norm (ms) L-R H-Lat (ms) M-Lat (ms) HLat-MLat (ms)  Left Tibial (Gastroc)  32.4C     33.20 <35 1.36 4.63 28.57  Right Tibial (Gastroc)  32.4C     31.84 <35 1.36 4.63 27.21   EMG   Side Muscle Ins Act Fibs Psw Fasc Number Recrt Dur Dur. Amp Amp. Poly Poly. Comment  Right AntTibialis Nml Nml Nml Nml Nml Nml Nml Nml Nml Nml Nml Nml N/A  Right Gastroc  Nml Nml Nml Nml Nml Nml Nml Nml Nml Nml Nml Nml N/A  Right Flex Dig Long Nml Nml Nml Nml Nml Nml Nml Nml Nml Nml Nml Nml N/A  Right RectFemoris Nml Nml Nml Nml Nml Nml Nml Nml Nml Nml Nml Nml N/A  Right BicepsFemS Nml Nml Nml Nml Nml Nml Nml Nml Nml Nml Nml Nml N/A  Right GluteusMed Nml Nml Nml Nml Nml Nml Nml Nml Nml Nml Nml Nml N/A  Left AntTibialis Nml Nml Nml Nml Nml Nml Nml Nml Nml Nml Nml Nml N/A  Left Gastroc Nml Nml Nml Nml Nml Nml Nml Nml Nml Nml Nml Nml N/A      Waveforms:

## 2015-06-15 ENCOUNTER — Telehealth: Payer: Self-pay | Admitting: Neurology

## 2015-06-15 NOTE — Telephone Encounter (Signed)
Jessica with Dr. Darden Dates office called and would like you to call her with the dx needed For requested lab on this pat.  [phone]  You can leave a vm message if she doesn't pick up.

## 2015-06-15 NOTE — Telephone Encounter (Signed)
LMOM for Heather Bray making her aware G60.9 is the code for labs - peripheral neuropathy.

## 2015-06-27 ENCOUNTER — Encounter: Payer: BLUE CROSS/BLUE SHIELD | Admitting: Neurology

## 2015-08-07 ENCOUNTER — Telehealth: Payer: Self-pay | Admitting: Neurology

## 2015-08-07 NOTE — Telephone Encounter (Signed)
Left message for Dr. Noland Fordyce assistant to call back to discuss results of B12 levels on 08/01/2015 labwork- two different levels. Awaiting call back.

## 2015-08-08 NOTE — Telephone Encounter (Signed)
Spoke with Janett Billow and she states they are not sure why patient had a B12 level of 463 and 623 on the same day. She states Dr. Alyson Ingles is going to speak with the lab director to see what happened and they will let us know.

## 2015-08-08 NOTE — Telephone Encounter (Signed)
Dr. Alyson Ingles called back and stated that one B12 level was resulted at [facility] lab and one was sent to [facility] which is why there is a difference in results - he states they both have a different reference range.  Both are within the middle of normal limits.

## 2015-11-10 ENCOUNTER — Encounter: Payer: Self-pay | Admitting: Gynecology

## 2015-12-25 ENCOUNTER — Encounter: Payer: BLUE CROSS/BLUE SHIELD | Admitting: Gynecology

## 2016-05-07 ENCOUNTER — Ambulatory Visit
Admission: RE | Admit: 2016-05-07 | Discharge: 2016-05-07 | Disposition: A | Discharge status: Home / Self Care | Payer: 59 | Source: Ambulatory Visit | Attending: Family Medicine | Admitting: Family Medicine

## 2016-05-07 ENCOUNTER — Other Ambulatory Visit: Payer: Self-pay | Admitting: Family Medicine

## 2016-05-07 DIAGNOSIS — M545 Low back pain, unspecified: Secondary | ICD-10-CM

## 2016-07-24 DIAGNOSIS — M79645 Pain in left finger(s): Secondary | ICD-10-CM | POA: Diagnosis not present

## 2016-07-24 DIAGNOSIS — R2232 Localized swelling, mass and lump, left upper limb: Secondary | ICD-10-CM | POA: Diagnosis not present

## 2016-10-12 DIAGNOSIS — S61412A Laceration without foreign body of left hand, initial encounter: Secondary | ICD-10-CM | POA: Diagnosis not present

## 2016-11-06 DIAGNOSIS — Z719 Counseling, unspecified: Secondary | ICD-10-CM | POA: Diagnosis not present

## 2016-11-20 DIAGNOSIS — Z719 Counseling, unspecified: Secondary | ICD-10-CM | POA: Diagnosis not present

## 2016-11-21 DIAGNOSIS — Z13220 Encounter for screening for lipoid disorders: Secondary | ICD-10-CM | POA: Diagnosis not present

## 2016-11-21 DIAGNOSIS — H903 Sensorineural hearing loss, bilateral: Secondary | ICD-10-CM | POA: Diagnosis not present

## 2016-11-21 DIAGNOSIS — Z0000 Encounter for general adult medical examination without abnormal findings: Secondary | ICD-10-CM | POA: Diagnosis not present

## 2016-11-21 DIAGNOSIS — Z1159 Encounter for screening for other viral diseases: Secondary | ICD-10-CM | POA: Diagnosis not present

## 2017-01-31 DIAGNOSIS — Z23 Encounter for immunization: Secondary | ICD-10-CM | POA: Diagnosis not present

## 2017-02-15 DIAGNOSIS — J029 Acute pharyngitis, unspecified: Secondary | ICD-10-CM | POA: Diagnosis not present

## 2017-02-20 DIAGNOSIS — D485 Neoplasm of uncertain behavior of skin: Secondary | ICD-10-CM | POA: Diagnosis not present

## 2017-02-20 DIAGNOSIS — D225 Melanocytic nevi of trunk: Secondary | ICD-10-CM | POA: Diagnosis not present

## 2017-02-20 DIAGNOSIS — D2262 Melanocytic nevi of left upper limb, including shoulder: Secondary | ICD-10-CM | POA: Diagnosis not present

## 2017-02-20 DIAGNOSIS — L814 Other melanin hyperpigmentation: Secondary | ICD-10-CM | POA: Diagnosis not present

## 2017-02-20 DIAGNOSIS — D2272 Melanocytic nevi of left lower limb, including hip: Secondary | ICD-10-CM | POA: Diagnosis not present

## 2017-02-20 DIAGNOSIS — L720 Epidermal cyst: Secondary | ICD-10-CM | POA: Diagnosis not present

## 2017-05-02 ENCOUNTER — Other Ambulatory Visit (HOSPITAL_COMMUNITY): Payer: Self-pay | Admitting: Otolaryngology

## 2017-05-02 DIAGNOSIS — J342 Deviated nasal septum: Secondary | ICD-10-CM | POA: Diagnosis not present

## 2017-05-02 DIAGNOSIS — H93A3 Pulsatile tinnitus, bilateral: Secondary | ICD-10-CM

## 2017-05-02 DIAGNOSIS — H903 Sensorineural hearing loss, bilateral: Secondary | ICD-10-CM | POA: Diagnosis not present

## 2017-05-02 DIAGNOSIS — H9313 Tinnitus, bilateral: Secondary | ICD-10-CM | POA: Diagnosis not present

## 2017-05-06 ENCOUNTER — Telehealth: Payer: Self-pay | Admitting: Neurology

## 2017-05-06 NOTE — Telephone Encounter (Signed)
Review of patient's records from Dr. Ernesto Rutherford dated May 02, 2017.  Patient presented with complaints of pulsatile tinnitus.  Patient had an MRI and MRA in 2015, which according to Dr. Berle Mull records were normal.  He reordered these images.  She had an audiogram done in November, 2017 which showed a severe sensorineural hearing loss in the lower frequencies.  Patient has an appointment with me next week.

## 2017-05-08 ENCOUNTER — Other Ambulatory Visit (HOSPITAL_COMMUNITY): Payer: Self-pay | Admitting: Otolaryngology

## 2017-05-08 ENCOUNTER — Encounter: Payer: Self-pay | Admitting: Gynecology

## 2017-05-08 DIAGNOSIS — Z803 Family history of malignant neoplasm of breast: Secondary | ICD-10-CM | POA: Diagnosis not present

## 2017-05-08 DIAGNOSIS — H93A3 Pulsatile tinnitus, bilateral: Secondary | ICD-10-CM

## 2017-05-08 DIAGNOSIS — Z1231 Encounter for screening mammogram for malignant neoplasm of breast: Secondary | ICD-10-CM | POA: Diagnosis not present

## 2017-05-09 ENCOUNTER — Ambulatory Visit (HOSPITAL_COMMUNITY)
Admission: RE | Admit: 2017-05-09 | Discharge: 2017-05-09 | Disposition: A | Discharge status: Home / Self Care | Payer: 59 | Source: Ambulatory Visit | Attending: Otolaryngology | Admitting: Otolaryngology

## 2017-05-09 DIAGNOSIS — H93A3 Pulsatile tinnitus, bilateral: Secondary | ICD-10-CM | POA: Diagnosis present

## 2017-05-09 DIAGNOSIS — H9319 Tinnitus, unspecified ear: Secondary | ICD-10-CM | POA: Diagnosis not present

## 2017-05-09 MED ORDER — GADOBENATE DIMEGLUMINE 529 MG/ML IV SOLN
15.0000 mL | Freq: Once | INTRAVENOUS | Status: AC | PRN
  Administered 2017-05-09: 15 mL via INTRAVENOUS

## 2017-05-13 NOTE — Progress Notes (Deleted)
Heather Bray was seen today in the movement disorders clinic for neurologic consultation at the request of [name], P.A.  Her PCP is [name], MD.  The consultation is for the evaluation of falls x few years and trouble picking up her feet (4 over the past year per the patient).  The records that were made available to me were reviewed.  Pt states that she is unsure of why she falls but she is a marathon runner and the falls occurred when running.  Pt states that it seems to occur when going up a curb or when she is on the greenway and then she trips over a root that is growing.   She did recently see her eye doctor in December and he thought that wearing of sunglasses contributed to falls and told her to stop wearing sunglasses while running and that has helped.   She is now wearing a visor when running but admits that she hasn't run a lot because her husband had a stroke in January.  Her feet are numb sometimes but usually only on the ellipical.  She does yoga and skis and states that her balance is otherwise good.  She has a hx of gestational DM but no other hx of DM.  She states that she had blood work at end of Jan.  She takes a B complex vitamin but no hx of B12 deficiency - states seem to help with allergies.    05/13/17 update: Patient is seen back in follow-up at the request of her ENT physician.  I have not seen her since 2017.  I have reviewed records from Dr. Ernesto Rutherford.  These were dated May 02, 2017.  Patient went to him with pulsatile tinnitus.  Patient had previously had an MRI and MRA of the brain in 2015 which according to Dr. Berle Mull records were normal.  He reordered these images and were completed on May 09, 2017.  I did review these images.  These again were normal.  She had an audiogram done in November, 2017 which showed severe sensorineural hearing loss in the lower frequencies.     ALLERGIES:  No Known Allergies  CURRENT MEDICATIONS:  Outpatient  Encounter Medications as of 05/14/2017  Medication Sig  . ALPRAZolam (XANAX) 0.25 MG tablet Take 1 tablet (0.25 mg total) by mouth at bedtime as needed.  . B Complex Vitamins (B COMPLEX 1 PO) Take by mouth daily.    . Calcium-Vitamin D-Vitamin K (CALCIUM SOFT CHEWS PO) Take by mouth. Takes 2 chews daily   . ibuprofen (ADVIL,MOTRIN) 800 MG tablet Take 1 tablet (800 mg total) by mouth every 8 (eight) hours as needed for pain.  Marland Kitchen MAGNESIUM CHLORIDE PO Take by mouth. Takes 1 tablet daily    No facility-administered encounter medications on file as of 05/14/2017.     PAST MEDICAL HISTORY:   Past Medical History:  Diagnosis Date  . Gestational diabetes   . Ovarian cyst     PAST SURGICAL HISTORY:  No past surgical history on file.  SOCIAL HISTORY:   Social History   Socioeconomic History  . Marital status: Married    Spouse name: Not on file  . Number of children: Not on file  . Years of education: Not on file  . Highest education level: Not on file  Social Needs  . Financial resource strain: Not on file  . Food insecurity - worry: Not on file  . Food insecurity - inability: Not on file  .  Transportation needs - medical: Not on file  . Transportation needs - non-medical: Not on file  Occupational History  . Not on file  Tobacco Use  . Smoking status: Former Smoker    Last attempt to quit: 12/19/2000    Years since quitting: 16.4  . Smokeless tobacco: Never Used  Substance and Sexual Activity  . Alcohol use: Yes    Comment: 4 nights per week - 1-2 glasses per night  . Drug use: No  . Sexual activity: Yes    Birth control/protection: Condom  Other Topics Concern  . Not on file  Social History Narrative  . Not on file    FAMILY HISTORY:   Family Status  Relation Name Status  . Mother  Alive       breast CA  . Father  Alive       aortic valve disease  . Brother  Alive       healthy  . 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  Denies neck  or back pain.  A complete 10 system review of systems was obtained and was unremarkable apart from what is mentioned above.  PHYSICAL EXAMINATION:    VITALS:   There were no vitals filed for this visit.  GEN:  The patient appears stated age and is in NAD. HEENT:  Normocephalic, atraumatic.  The mucous membranes are moist. The superficial temporal arteries are without ropiness or tenderness. CV:  RRR Lungs:  CTAB Neck/HEME:  There are no carotid bruits bilaterally.  Neurological examination:  Orientation: The patient is alert and oriented x3. Fund of knowledge is appropriate.  Recent and remote memory are intact.  Attention and concentration are normal.    Able to name objects and repeat phrases. Cranial nerves: There is good facial symmetry. Pupils are equal round and reactive to light bilaterally. Fundoscopic exam reveals clear margins bilaterally. Extraocular muscles are intact. The visual fields are full to confrontational testing. The speech is fluent and clear. Soft palate rises symmetrically and there is no tongue deviation. Hearing is slightly decreased to conversational tone. Sensation: Sensation is intact to light and pinprick throughout (facial, trunk, extremities). Does report decreased pin in stocking distribution.  Vibration is intact at the bilateral big toe and only slightly decreased. There is no extinction with double simultaneous stimulation. There is no sensory dermatomal level identified. Motor: Strength is 5/5 in the bilateral upper and lower extremities.   Shoulder shrug is equal and symmetric.  There is no pronator drift. Deep tendon reflexes: Deep tendon reflexes are 2+/4 at the bilateral biceps, triceps, brachioradialis, patella and achilles. Plantar responses are downgoing bilaterally.  Movement examination: Tone: There is normal tone in the bilateral upper extremities.  The tone in the lower extremities is normal.  Abnormal movements: none Coordination:  There is no  decremation with RAM's, with any form of RAMS, including alternating supination and pronation of the forearm, hand opening and closing, finger taps, heel taps and toe taps. Gait and Station: The patient has no difficulty arising out of a deep-seated chair without the use of the hands. The patient's stride length is normal.  Ambulates in tandem fashion with no trouble.  Able to stand in romberg position without trouble.     ASSESSMENT/PLAN:  1.  Falls while running  -none since d/c the use of sunglasses but hasn't run much either  -may have mild evidence of PN.  Didn't  do labs until can get labs from PCP since pt reports done less than 3 weeks ago.  Will try to get a copy.  Does have hx of gestational DM but reports labs always have looked good.  Only other risk factor for PN is EtOH and she has just recently started drinking a little more as her husband had a stroke in [place] and she is very stressed.  She is drinking 4-8 glasses wine/week.  -will schedule EMG  -reports that she had an MRI brain done a year ago.  I tried to look her up through [facility] and through [facility] and couldn't find her.  States that it was normal and done for increased hearing loss/tinnitus.    -neuro examination non focal and non lateralizing today.

## 2017-05-14 ENCOUNTER — Ambulatory Visit: Payer: 59 | Admitting: Neurology

## 2018-02-05 ENCOUNTER — Ambulatory Visit: Payer: No Typology Code available for payment source | Admitting: Gynecology

## 2018-02-05 ENCOUNTER — Encounter: Payer: Self-pay | Admitting: Gynecology

## 2018-02-05 VITALS — BP 120/76 | Ht 63.0 in | Wt 153.0 lb

## 2018-02-05 DIAGNOSIS — N950 Postmenopausal bleeding: Secondary | ICD-10-CM

## 2018-02-05 NOTE — Progress Notes (Signed)
    Heather Bray [birth year] [id number]        57 y.o.  G2P0002 presents with a history of menstrual-like blood flow this past week.  Associated with cramping and breast tenderness.  She felt like she was going to start her.  My her symptoms.  Last bleeding before this was 2 to 3 years ago.  Last gynecologic exam 2016.  Past medical history,surgical history, problem list, medications, allergies, family history and social history were all reviewed and documented in the EPIC chart.  Directed ROS with pertinent positives and negatives documented in the history of present illness/assessment and plan.  Exam: Programmer, systems Vitals:   02/05/18 1112  BP: 120/76  Weight: 153 lb (69.4 kg)  Height: 5\' 3"  (1.6 m)   General appearance:  Normal Abdomen soft nontender without masses guarding rebound Elevated external BUS vagina with atrophic changes.  Cervix with atrophic changes.  Uterus grossly normal midline mobile nontender.  Adnexa without masses or tenderness.  Assessment/Plan:  57 y.o. G2P0002 with postmenopausal bleeding.  Reviewed differential with the patient to include escape ovulation which given the moliminal symptoms sounds possible, atrophic changes, polyps/submucous myomas, hyperplastic/carcinoma all reviewed.  Recommended patient schedule in follow-up for sonohysterogram for endometrial assessment.  Patient agrees to schedule follow-up for this.  She will also follow-up for her annual exam.    Anastasio Auerbach MD, 11:35 AM 02/05/2018

## 2018-02-05 NOTE — Patient Instructions (Signed)
Follow-up for the ultrasound as scheduled. 

## 2018-02-10 ENCOUNTER — Other Ambulatory Visit: Payer: Self-pay | Admitting: Gynecology

## 2018-02-10 DIAGNOSIS — N950 Postmenopausal bleeding: Secondary | ICD-10-CM

## 2018-02-24 ENCOUNTER — Ambulatory Visit: Payer: No Typology Code available for payment source | Admitting: Gynecology

## 2018-02-24 ENCOUNTER — Other Ambulatory Visit: Payer: No Typology Code available for payment source

## 2018-05-04 ENCOUNTER — Ambulatory Visit (INDEPENDENT_AMBULATORY_CARE_PROVIDER_SITE_OTHER): Payer: No Typology Code available for payment source | Admitting: Gynecology

## 2018-05-04 ENCOUNTER — Ambulatory Visit (INDEPENDENT_AMBULATORY_CARE_PROVIDER_SITE_OTHER): Payer: No Typology Code available for payment source

## 2018-05-04 ENCOUNTER — Encounter: Payer: Self-pay | Admitting: Gynecology

## 2018-05-04 VITALS — BP 120/76

## 2018-05-04 DIAGNOSIS — N950 Postmenopausal bleeding: Secondary | ICD-10-CM

## 2018-05-04 MED ORDER — ALPRAZOLAM 0.25 MG PO TABS: 0.2500 mg | ORAL_TABLET | Freq: Every evening | ORAL | 1 refills | Status: AC | PRN

## 2018-05-04 NOTE — Progress Notes (Signed)
    Heather Bray 1960-09-11 [id number]        58 y.o.  G2P0002 presents for sonohysterogram.  History of single episode of postmenopausal bleeding in October.  No bleeding since.  Past medical history,surgical history, problem list, medications, allergies, family history and social history were all reviewed and documented in the EPIC chart.  Directed ROS with pertinent positives and negatives documented in the history of present illness/assessment and plan.  Exam: [place] assistant Vitals:   05/04/18 0943  BP: 120/76   General appearance:  Normal Abdomen soft nontender without masses guarding rebound Pelvic external BUS vagina normal.  Cervix normal.  Uterus normal size midline mobile nontender.  Adnexa without masses or tenderness  Ultrasound transvaginal and transabdominal shows uterus normal size and echotexture.  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  Endometrial echo 2.9 mm.  Right and left ovaries normal and atrophic in appearance.  Cul-de-sac negative  Sonohysterogram performed, sterile technique, easy catheter introduction, good distention with no abnormality seen.  Endometrial sample taken.  Patient tolerated well.  Assessment/Plan:  58 y.o. G2P0002 with single episode of postmenopausal bleeding.  Sonohysterogram is normal.  Patient will follow-up for biopsy results.  Assuming acceptable then will call if any further bleeding but will plan monitoring for now.  Patient also is going to schedule an annual exam.  Asked for refill of Xanax which she uses occasionally.  #30 with 1 refill provided.    Anastasio Auerbach MD, 10:21 AM 05/04/2018

## 2018-05-04 NOTE — Patient Instructions (Addendum)
Office will call you with biopsy results Follow-up for annual exam is scheduled

## 2018-05-05 ENCOUNTER — Other Ambulatory Visit: Payer: No Typology Code available for payment source

## 2018-05-05 ENCOUNTER — Ambulatory Visit: Payer: No Typology Code available for payment source | Admitting: Gynecology

## 2018-08-15 IMAGING — MR MR MRA HEAD W/O CM
12 of 16 series · 24 of 48 positions shown · IV contrast (multihance)
Comparison: None.

CLINICAL DATA: Severe tinnitus.  Symptoms are worse with exercise.

EXAM:
MRI HEAD WITHOUT AND WITH CONTRAST
MRA HEAD WITHOUT CONTRAST
TECHNIQUE: Multiplanar, multiecho pulse sequences of the brain and surrounding
structures were obtained without and with intravenous contrast.
Angiographic images of the head were obtained using MRA technique
without contrast.
CONTRAST:  15mL MULTIHANCE GADOBENATE DIMEGLUMINE 529 MG/ML IV SOLN

[Series 2: FLAIR · sagittal · 5.0mm · 0.47mm/px · 1 of 23 slices shown (1 of 2)]
[im 1/23]
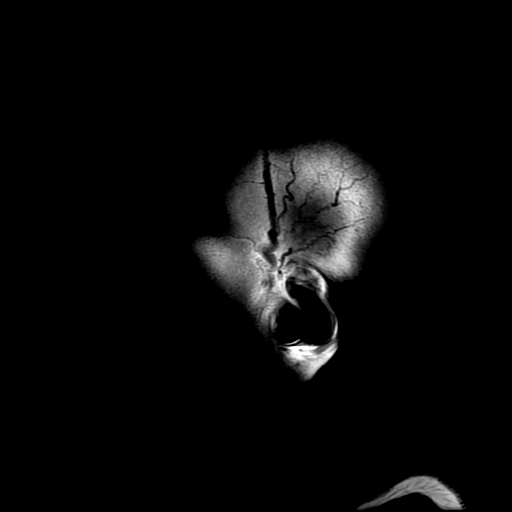

[Series 4: DWI · axial · 3.0mm · 0.94mm/px · z∈[-50,+97]mm · 5 of 100 slices shown]
[im 1/100]
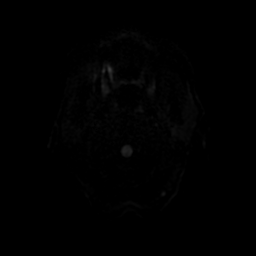
[im 25/100]
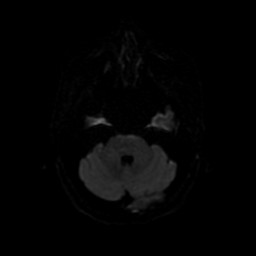
[im 50/100]
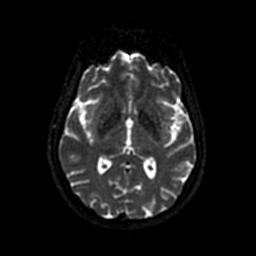
[im 75/100]
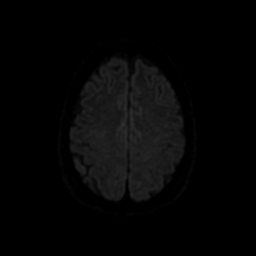
[im 100/100]
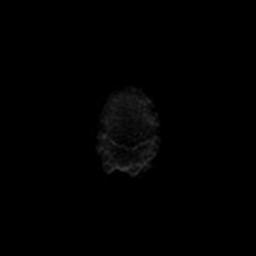

[Series 5: T2 · axial · 5.0mm · 0.47mm/px · 1 of 26 slices shown]
[im 1/26]
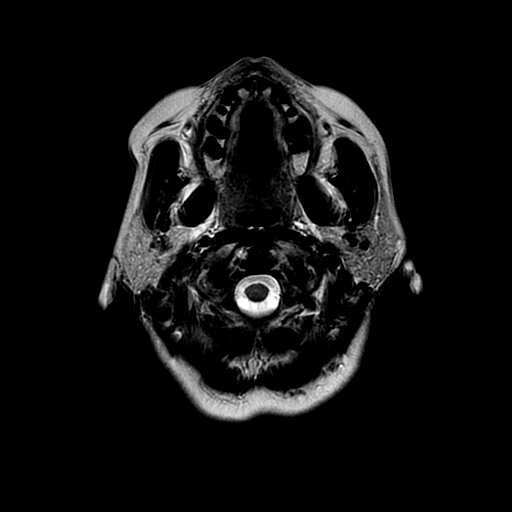

[Series 6: FLAIR · axial · 5.0mm · 0.47mm/px · 1 of 26 slices shown (2 of 2)]
[im 1/26]
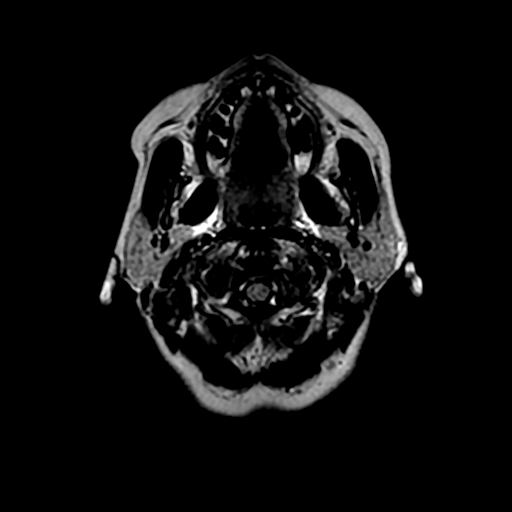

[Series 7: (person_name) · axial · 3.0mm · 0.47mm/px · z∈[-37,+81]mm · 5 of 100 slices shown]
[im 1/100]
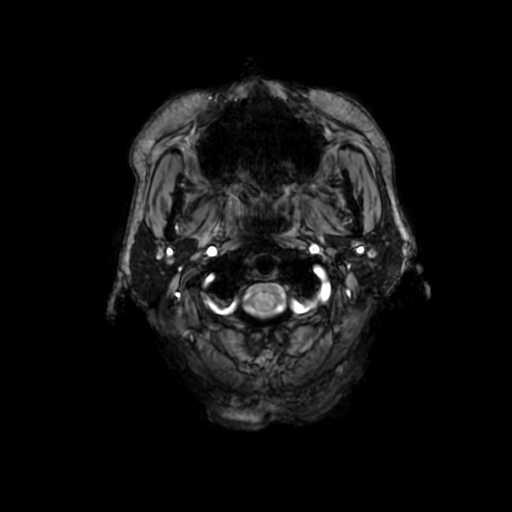
[im 20/100]
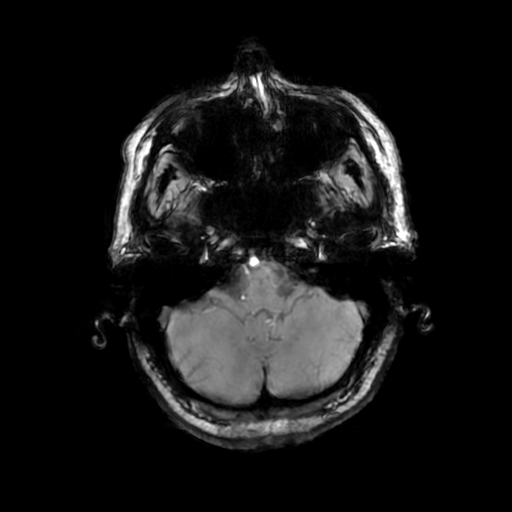
[im 40/100]
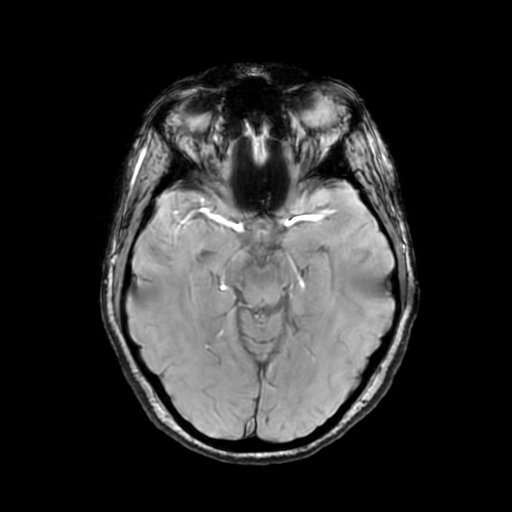
[im 60/100]
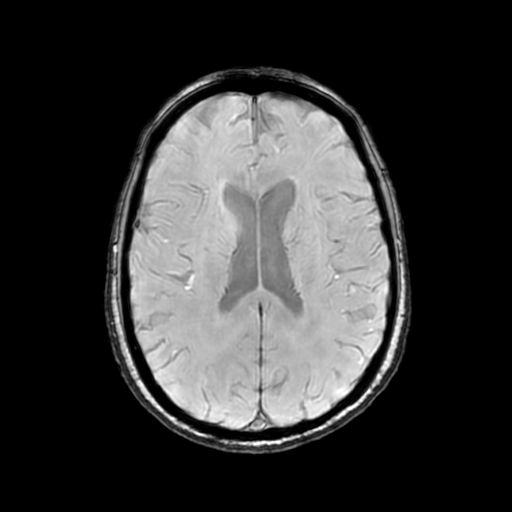
[im 80/100]
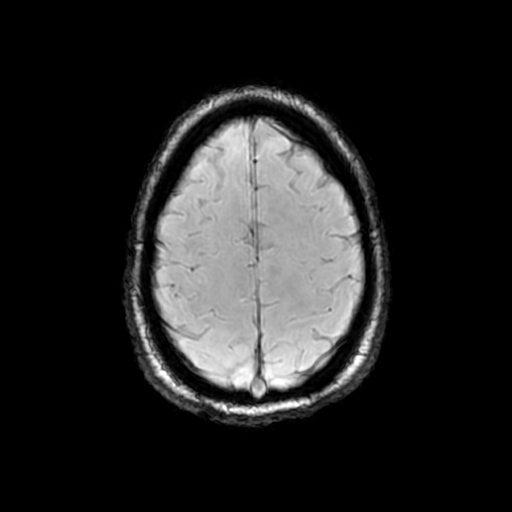

[Series 10: T2 post-contrast · coronal · 5.0mm · 0.39mm/px · 2 of 27 slices shown]
[im 1/27]
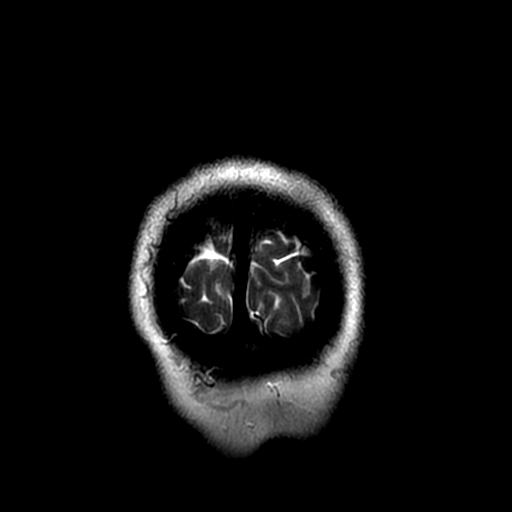
[im 27/27]
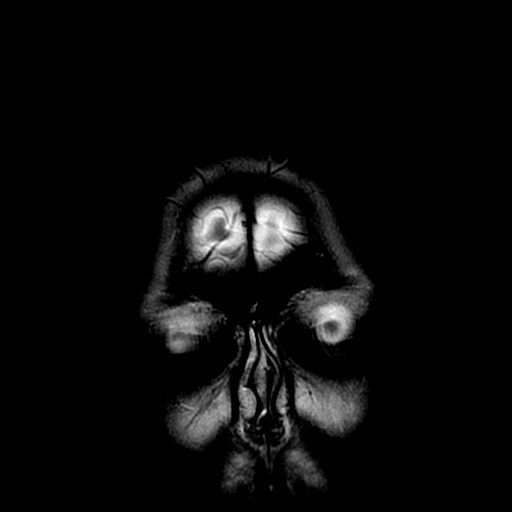

[Series 12: T1 · axial · 3.0mm · 0.35mm/px · 1 of 19 slices shown (1 of 5)]
[im 1/19]
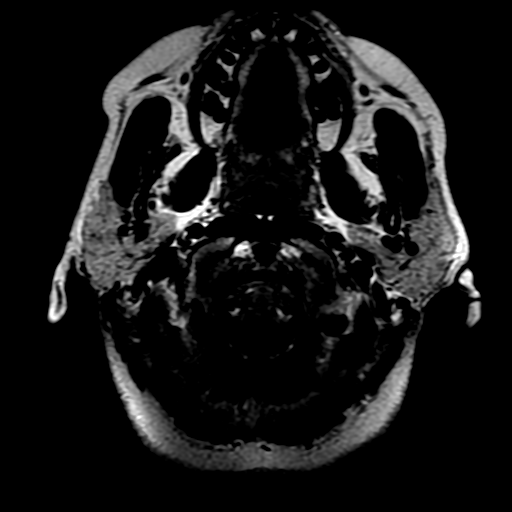

[Series 13: T1 · coronal · 3.0mm · 0.35mm/px · 1 of 19 slices shown (2 of 5)]
[im 1/19]
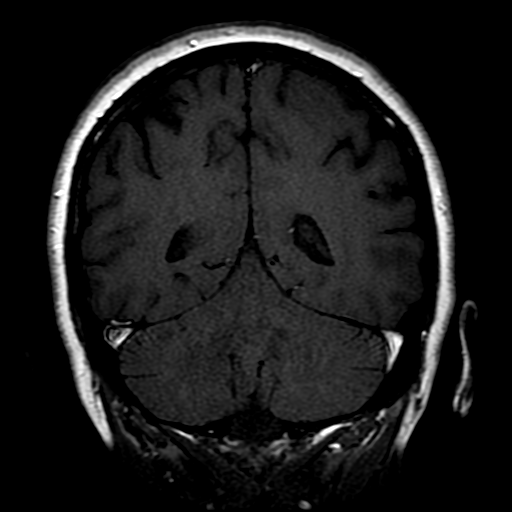

[Series 14: T1 · axial · 3.0mm · 0.35mm/px · 1 of 19 slices shown (3 of 5)]
[im 1/19]
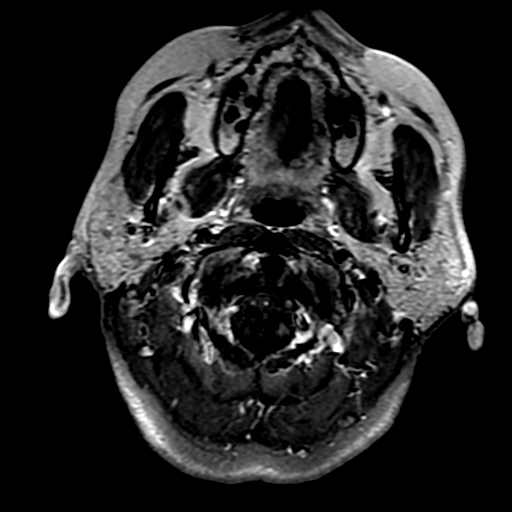

[Series 15: T1 · coronal · 3.0mm · 0.35mm/px · 1 of 19 slices shown (4 of 5)]
[im 1/19]
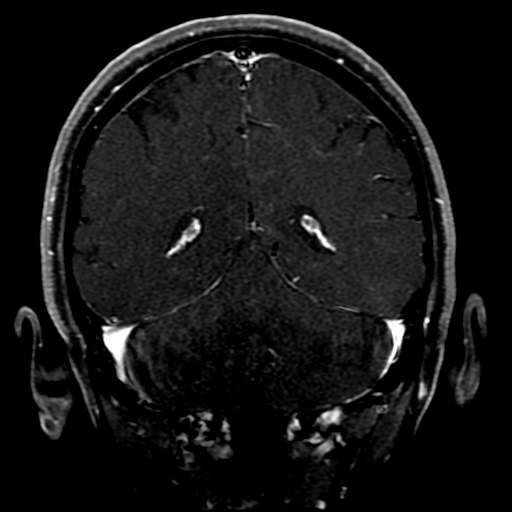

[Series 17: T1 · coronal · 5.0mm · 0.47mm/px · 2 of 28 slices shown (5 of 5)]
[im 1/28]
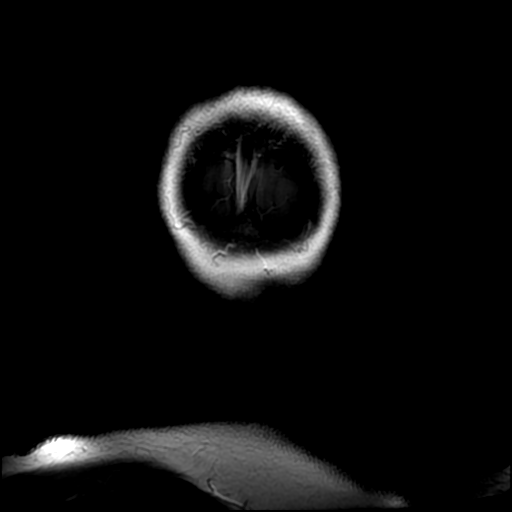
[im 28/28]
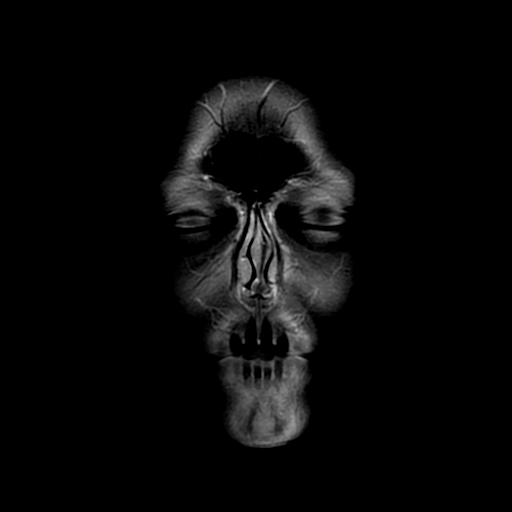

[Series 450: ADC · axial · 3.0mm · 0.94mm/px · z∈[-50,+97]mm · 3 of 50 slices shown]
[im 1/50]
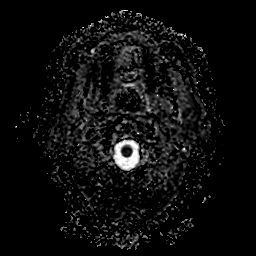
[im 25/50]
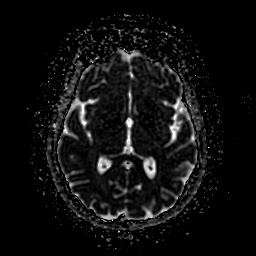
[im 50/50]
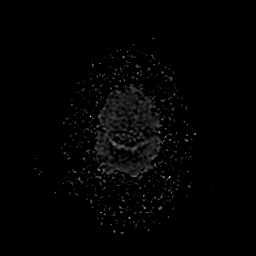

[24 of 48 positions shown; findings below may reference images not displayed]

FINDINGS: MRI HEAD FINDINGS

Brain: No acute infarct, hemorrhage, or mass lesion is present. The
ventricles are of normal size. No significant extraaxial fluid
collection is present.

No significant white matter disease is present. The postcontrast
images demonstrate no pathologic enhancement. Brainstem and
cerebellum are normal.

Dedicated imaging of the internal auditory canals demonstrates no
pathologic enhancement. The seventh and eighth cranial nerves are
discretely visualized and within normal limits. There is no mass
lesion. Pituitary gland is normal. Cavernous sinus is within normal
limits bilaterally.

Vascular: Flow is present in the major intracranial arteries.

Skull and upper cervical spine: The skull base is within normal
limits. The craniocervical junction is normal.

Sinuses/Orbits: The paranasal sinuses and mastoid air cells are
clear. Globes and orbits are within normal limits.

MRA HEAD FINDINGS

The internal carotid arteries are within normal limits from the high
cervical segments through the ICA termini bilaterally. The A1 and M1
segments are normal. The anterior communicating artery is patent.
MCA bifurcations are unremarkable. ACA and MCA branch vessels are
within normal limits. There is no aneurysm or aberrant vessel.

The left vertebral artery is the dominant vessel. PICA origins are
visualized and normal. Both posterior cerebral arteries originate
from the basilar tip. PCA branch vessels are within normal limits.
IMPRESSION: 1. Normal MRI the brain with dedicated imaging of the internal
auditory canals.
2. Normal variant MRA circle of [place] without significant proximal
stenosis, aneurysm, or branch vessel occlusion.

## 2019-01-12 ENCOUNTER — Encounter: Payer: Self-pay | Admitting: Gynecology

## 2019-02-08 ENCOUNTER — Ambulatory Visit (INDEPENDENT_AMBULATORY_CARE_PROVIDER_SITE_OTHER): Payer: No Typology Code available for payment source | Admitting: Gynecology

## 2019-02-08 ENCOUNTER — Other Ambulatory Visit: Payer: Self-pay

## 2019-02-08 ENCOUNTER — Encounter: Payer: Self-pay | Admitting: Gynecology

## 2019-02-08 ENCOUNTER — Telehealth: Payer: Self-pay | Admitting: *Deleted

## 2019-02-08 VITALS — BP 122/76 | Ht 63.0 in | Wt 146.0 lb

## 2019-02-08 DIAGNOSIS — Z01419 Encounter for gynecological examination (general) (routine) without abnormal findings: Secondary | ICD-10-CM | POA: Diagnosis not present

## 2019-02-08 DIAGNOSIS — Z1151 Encounter for screening for human papillomavirus (HPV): Secondary | ICD-10-CM

## 2019-02-08 DIAGNOSIS — N952 Postmenopausal atrophic vaginitis: Secondary | ICD-10-CM

## 2019-02-08 NOTE — Telephone Encounter (Signed)
Patient scheduled on 02/16/19 @ 8:45am at [facility] order faxed.   Left message for patient to call.

## 2019-02-08 NOTE — Progress Notes (Signed)
    Zady Reader 01-Apr-1961 [id number]        58 y.o.  G2P0002 for annual gynecologic exam.  Had evaluation earlier this year for episode of postmenopausal bleeding with negative sonohysterogram and negative endometrial biopsy.  Has done no bleeding since.  Has recently felt a lump anterior chest wall/lower breast.  Past medical history,surgical history, problem list, medications, allergies, family history and social history were all reviewed and documented as reviewed in the EPIC chart.  ROS:  Performed with pertinent positives and negatives included in the history, assessment and plan.   Additional significant findings : None   Exam: Caryn [place] assistant Vitals:   02/08/19 0933  BP: 122/76  Weight: 146 lb (66.2 kg)  Height: 5\' 3"  (1.6 m)   Body mass index is 25.86 kg/m.  General appearance:  Normal affect, orientation and appearance. Skin: Grossly normal HEENT: Without gross lesions.  No cervical or supraclavicular adenopathy. Thyroid normal.  Lungs:  Clear without wheezing, rales or rhonchi Cardiac: RR, without RMG Abdominal:  Soft, nontender, without masses, guarding, rebound, organomegaly or hernia Breasts:  Examined lying and sitting without masses, retractions, discharge or axillary adenopathy. Pelvic:  Ext, BUS, Vagina: With atrophic changes  Cervix: With atrophic changes.  Pap smear done  Uterus: Anteverted, normal size, shape and contour, midline and mobile nontender   Adnexa: Without masses or tenderness    Anus and perineum: Normal   Rectovaginal: Normal sphincter tone without palpated masses or tenderness.   Physical Exam  Pulmonary/Chest:        Assessment/Plan:  58 y.o. G44P0002 female for annual gynecologic exam.   1. Reported lump on self-exam.  Physician exam is negative.  Area of patient's pointing to is outside the breast at 6:00 along anterior rib line as diagrammed.  Recommend mammogram as she is overdue and will arrange for ultrasound over this  area.  Patient knows to call my office if she does not hear from [facility] to schedule this. 2. Postmenopausal.  Without significant menopausal symptoms.  Has done no bleeding this past year after her sonohysterogram. 3. Pap smear/HPV 12/2014.  Pap smear/HPV today.  No history of significant abnormal Pap smears. 4. Colonoscopy 2012.  Repeat at their recommended interval. 5. DEXA never.  Will plan at age 65. 49. Health maintenance.  No routine lab work done as patient does this elsewhere.  Follow-up in 1 year, sooner as needed.   Anastasio Auerbach MD, 10:03 AM 02/08/2019

## 2019-02-08 NOTE — Telephone Encounter (Signed)
-----   Message from Anastasio Auerbach, MD sent at 02/08/2019 10:05 AM EDT ----- Arrange for diagnostic mammography at [facility].  Also for ultrasound.  Patient reports mass at 6 o'clock position below breast.  Physician exam is negative.

## 2019-02-08 NOTE — Addendum Note (Signed)
Addended by: Nelva Nay on: 02/08/2019 10:20 AM   Modules accepted: Orders

## 2019-02-08 NOTE — Patient Instructions (Signed)
[facility] should call you to arrange for the mammogram and ultrasound.  Call my office if you do not hear from them over the next week or so.  Follow-up in 1 year for annual exam.

## 2019-02-10 LAB — PAP IG AND HPV HIGH-RISK: HPV DNA High Risk: NOT DETECTED

## 2019-02-10 NOTE — Telephone Encounter (Signed)
Called again and left detailed message on cell per DPR access with time and date. I asked patient to call me to confirm she received my message.

## 2019-02-10 NOTE — Telephone Encounter (Signed)
Patient received my message.

## 2019-02-16 ENCOUNTER — Encounter: Payer: Self-pay | Admitting: Gynecology

## 2019-03-31 ENCOUNTER — Ambulatory Visit: Payer: No Typology Code available for payment source | Attending: Internal Medicine

## 2019-03-31 ENCOUNTER — Other Ambulatory Visit: Payer: Self-pay

## 2019-03-31 DIAGNOSIS — Z20828 Contact with and (suspected) exposure to other viral communicable diseases: Secondary | ICD-10-CM | POA: Insufficient documentation

## 2019-03-31 DIAGNOSIS — Z20822 Contact with and (suspected) exposure to covid-19: Secondary | ICD-10-CM

## 2019-04-01 NOTE — Progress Notes (Signed)
Order(s) created erroneously. Erroneous order ID: [id number]  Order moved by: Brigitte Pulse  Order move date/time: 04/01/2019 2:44 PM  Source Patient: [id number]  Source Contact: 03/31/2019  Destination Patient: [id number]  Destination Contact: 03/31/2019

## 2019-04-01 NOTE — Progress Notes (Signed)
Orders moved to this encounter.

## 2019-04-02 LAB — NOVEL CORONAVIRUS, NAA: SARS-CoV-2, NAA: NOT DETECTED

## 2020-02-09 ENCOUNTER — Encounter: Payer: No Typology Code available for payment source | Admitting: Obstetrics and Gynecology

## 2020-05-15 DIAGNOSIS — Z1231 Encounter for screening mammogram for malignant neoplasm of breast: Secondary | ICD-10-CM | POA: Diagnosis not present

## 2020-06-12 DIAGNOSIS — F331 Major depressive disorder, recurrent, moderate: Secondary | ICD-10-CM | POA: Diagnosis not present

## 2020-06-12 DIAGNOSIS — F419 Anxiety disorder, unspecified: Secondary | ICD-10-CM | POA: Diagnosis not present

## 2020-06-12 DIAGNOSIS — F418 Other specified anxiety disorders: Secondary | ICD-10-CM | POA: Diagnosis not present

## 2021-01-23 ENCOUNTER — Encounter: Payer: Self-pay | Admitting: Gastroenterology
